# Patient Record
Sex: Female | Born: 2000
Health system: Southern US, Community
[De-identification: ages and names within clinical notes are randomized; demographics above are authoritative.]

## PROBLEM LIST (undated history)

## (undated) DIAGNOSIS — R51 Headache: Secondary | ICD-10-CM

## (undated) DIAGNOSIS — F419 Anxiety disorder, unspecified: Secondary | ICD-10-CM

## (undated) DIAGNOSIS — R519 Headache, unspecified: Secondary | ICD-10-CM

## (undated) DIAGNOSIS — T7840XA Allergy, unspecified, initial encounter: Secondary | ICD-10-CM

## (undated) DIAGNOSIS — J45909 Unspecified asthma, uncomplicated: Secondary | ICD-10-CM

## (undated) DIAGNOSIS — G43909 Migraine, unspecified, not intractable, without status migrainosus: Secondary | ICD-10-CM

## (undated) HISTORY — DX: Allergy, unspecified, initial encounter: T78.40XA

## (undated) HISTORY — PX: OTHER SURGICAL HISTORY: SHX169

## (undated) HISTORY — DX: Headache: R51

## (undated) HISTORY — DX: Anxiety disorder, unspecified: F41.9

## (undated) HISTORY — DX: Migraine, unspecified, not intractable, without status migrainosus: G43.909

## (undated) HISTORY — DX: Headache, unspecified: R51.9

---
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
## 2022-01-10 ENCOUNTER — Encounter: Payer: Self-pay | Admitting: Neurology

## 2022-01-11 ENCOUNTER — Other Ambulatory Visit (HOSPITAL_COMMUNITY): Payer: Self-pay

## 2022-01-11 ENCOUNTER — Encounter: Payer: Self-pay | Admitting: Neurology

## 2022-01-11 ENCOUNTER — Telehealth: Payer: Self-pay | Admitting: Neurology

## 2022-01-11 ENCOUNTER — Telehealth (INDEPENDENT_AMBULATORY_CARE_PROVIDER_SITE_OTHER): Payer: 59 | Admitting: Neurology

## 2022-01-11 DIAGNOSIS — J3089 Other allergic rhinitis: Secondary | ICD-10-CM

## 2022-01-11 DIAGNOSIS — G43109 Migraine with aura, not intractable, without status migrainosus: Secondary | ICD-10-CM | POA: Diagnosis not present

## 2022-01-11 DIAGNOSIS — J302 Other seasonal allergic rhinitis: Secondary | ICD-10-CM | POA: Diagnosis not present

## 2022-01-11 DIAGNOSIS — G43009 Migraine without aura, not intractable, without status migrainosus: Secondary | ICD-10-CM

## 2022-01-11 DIAGNOSIS — G44219 Episodic tension-type headache, not intractable: Secondary | ICD-10-CM | POA: Diagnosis not present

## 2022-01-11 DIAGNOSIS — T7840XA Allergy, unspecified, initial encounter: Secondary | ICD-10-CM

## 2022-01-11 MED ORDER — UBRELVY 100 MG PO TABS
100.0000 mg | ORAL_TABLET | ORAL | 11 refills | Status: DC | PRN
  Filled 2022-01-11 (×2): qty 16, 30d supply, fill #0

## 2022-01-11 MED ORDER — QULIPTA 60 MG PO TABS
1.0000 | ORAL_TABLET | Freq: Every day | ORAL | 11 refills | Status: DC
  Filled 2022-01-11: qty 30, 30d supply, fill #0
  Filled 2022-02-14: qty 30, 30d supply, fill #1
  Filled 2022-03-18: qty 30, 30d supply, fill #2
  Filled 2022-04-15: qty 30, 30d supply, fill #3
  Filled 2022-05-18: qty 30, 30d supply, fill #4
  Filled 2022-06-24: qty 30, 30d supply, fill #5
  Filled 2022-07-25: qty 30, 30d supply, fill #6
  Filled 2022-08-25: qty 30, 30d supply, fill #7
  Filled 2022-09-28: qty 30, 30d supply, fill #8
  Filled 2022-10-26: qty 30, 30d supply, fill #9
  Filled 2022-11-30: qty 30, 30d supply, fill #10

## 2022-01-11 NOTE — Telephone Encounter (Signed)
LVM asking pt to call back to schedule f/u

## 2022-01-11 NOTE — Telephone Encounter (Signed)
Can we prioritzie her PA for [place] she has run out and had to wait for appointment

## 2022-01-11 NOTE — Telephone Encounter (Signed)
Please schedule video f/u 10 months with dr Jaynee Eagles

## 2022-01-11 NOTE — Telephone Encounter (Signed)
Will look into Ubrelvy. Lenoria Chime was already approved through 12/2022.

## 2022-01-11 NOTE — Progress Notes (Signed)
[facility]    Provider:  Dr Jaynee Eagles Requesting Provider: Alba Cory, MD Primary Care Provider:  Alba Cory, MD  CC:  migraines  Virtual Visit via Video Note  I connected with Eileen Snyder on 01/11/22 at  8:30 AM EST by a video enabled telemedicine application and verified that I am speaking with the correct person using two identifiers.  Location: Patient: home Provider: office   I discussed the limitations of evaluation and management by telemedicine and the availability of in person appointments. The patient expressed understanding and agreed to proceed.   Follow Up Instructions:    I discussed the assessment and treatment plan with the patient. The patient was provided an opportunity to ask questions and all were answered. The patient agreed with the plan and demonstrated an understanding of the instructions.   The patient was advised to call back or seek an in-person evaluation if the symptoms worsen or if the condition fails to improve as anticipated.  I provided 20 minutes of non-face-to-face time during this encounter.   Melvenia Beam, MD  01/11/2022: She is doing well, she has 50% less headaches and migraines. When she does get them they are the same severity. She ran out of Ubrely. The ubrelvy would knock. Less than 4 migraine days a month and 4 headaches a month for a total of < 8 total headache days a month. No other focal neurologic deficits, associated symptoms, inciting events or modifiable factors. Patient complains of symptoms per HPI as well as the following symptoms: migraines . Pertinent negatives and positives per HPI. All others negative  Patient complains of symptoms per HPI as well as the following symptoms: dizzy wiith migraines . Pertinent negatives and positives per HPI. All others negative    HPI:  Eileen Snyder is a 21 y.o. adult here as requested by Alba Cory, MD for migraines.  She has a past medical history of  migraine without aura and without status migrainosus not intractable, migraine variant, episodic tension type headaches, temporomandibular joint dysfunction syndrome, family history of migraine, tremor of both hands, asthma.  I reviewed her chart in epic she last saw Dr. Gaynell Face in September 2022 for her migraine, prior to that she saw him in March of this year, her migraines have responded well to sumatriptan hand, she could not remember the last time she had a migraine either cut her mother, occasional tension type headaches that are treated with 2 Excedrin and respond well, they think that they had 1 visual aura without headache the past month, she is on Topamax and that caused the tremor also takes magnesium with helps in addition to the topiramate.  She is here alone. Red40 is a trigger, when she eats something that is red. They have been a lot better. She still has tension headaches a lot. Nosesprays and allergies make it worse and allergies are connected. Her migraines are all over her head, can start unilaterally then spread, it will start pulling and hurt everywhere, pulsating, poundng,throbbing, at the occipital areas sometimes. She does hapkaido. She gets exertional headaches with dizziness especially with hapkedo and head extension she almost passes out with neck pain. Migraines arepulsating/pounding/throbbing,rare nausea, light and sound sensitivity can be unilateral or occipital and spread around. What she calls "tension headaches" is left eye and front are of the face/facial pain.Gets allergies and this is what she thinks she may be getting. Sinus headaches, drainage and congestion all the time.  - may be tension type  headaches vs part of a migraine disorder or due to Allergies, sinus headaches, allergies: send to allergy referral. For the last several years she has 12 headaches days a month and unclear if migrainous, tension type, allergies, takes excedrin. She may get an accoassionally. White  line occ aura. Discussed risk of stroke. She takes magnesium which helps.   Reviewed notes, labs and imaging from outside physicians, which showed: see above  Review of Systems: Patient complains of symptoms per HPI as well as the following symptoms sinus headache. Pertinent negatives and positives per HPI. All others negative.   Social History   Socioeconomic History   Marital status: Single    Spouse name: Not on file   Number of children: Not on file   Years of education: Not on file   Highest education level: Not on file  Occupational History   Not on file  Tobacco Use   Smoking status: Never    Passive exposure: Never   Smokeless tobacco: Never  Vaping Use   Vaping Use: Never used  Substance and Sexual Activity   Alcohol use: No   Drug use: Never   Sexual activity: Not Currently  Other Topics Concern   Not on file  Social History Narrative   Eileen Snyder is a 11th grade student.   She attends [employer].   She lives with her parents and has an 44 yo sister.   She enjoys video games, [place], and her Ipad.   Social Determinants of Health   Financial Resource Strain: Not on file  Food Insecurity: Not on file  Transportation Needs: Not on file  Physical Activity: Not on file  Stress: Not on file  Social Connections: Not on file  Intimate Partner Violence: Not on file    Family History  Problem Relation Age of Onset   Migraines Mother     Past Medical History:  Diagnosis Date   Asthma    Headache     Patient Active Problem List   Diagnosis Date Noted   Seasonal and perennial allergic rhinitis 02/19/2021   Food intolerance 02/19/2021   Migraine with aura and without status migrainosus, not intractable 02/08/2021   Migraine variant 05/13/2019   Tremor of both hands 02/11/2019   Temporomandibular joint dysfunction syndrome 02/22/2016   Episodic tension type headache 02/22/2016   Migraine without aura and without status migrainosus, not intractable  02/22/2016   Family history of migraine 02/22/2016    Past Surgical History:  Procedure Laterality Date   Birthmark Removal     HIP ARTHROSCOPY Right 09/09/2021   LAPAROSCOPIC APPENDECTOMY N/A 06/06/2021   Procedure: APPENDECTOMY LAPAROSCOPIC;  Surgeon: Ralene Ok, MD;  Location: [facility];  Service: General;  Laterality: N/A;    Current Outpatient Medications  Medication Sig Dispense Refill   albuterol (PROVENTIL HFA;VENTOLIN HFA) 108 (90 Base) MCG/ACT inhaler Inhale 2 puffs into the lungs every 6 (six) hours as needed for wheezing or shortness of breath. 18 g 11   Atogepant (QULIPTA) 60 MG TABS Take 1 tablet by mouth daily. 30 tablet 11   atomoxetine (STRATTERA) 40 MG capsule Take 1 capsule (40 mg total) by mouth in the morning. 30 capsule 0   atomoxetine (STRATTERA) 60 MG capsule Take 1 capsule (60 mg total) by mouth in the morning. 30 capsule 2   azelastine (ASTELIN) 0.1 % nasal spray Use 2 sprays per nostril 1-2 times daily as needed. 30 mL 1   meloxicam (MOBIC) 15 MG tablet Take 1 tablet (  15 mg total) by mouth in the morning. Begin on POD #5 once indomethacin is complete. Take with food. 30 tablet 0   montelukast (SINGULAIR) 10 MG tablet Take 1 tablet (10 mg total) by mouth at bedtime. 90 tablet 3   ondansetron (ZOFRAN-ODT) 4 MG disintegrating tablet Take 1 tablet (4 mg total) by mouth every 8 (eight) hours as needed for nausea or vomiting for up to 7 day 20 tablet 0   sertraline (ZOLOFT) 50 MG tablet Take 1/2 tablet by mouth daily for 7 days, then take 1 tablet daily 30 tablet 2   traMADol (ULTRAM) 50 MG tablet Take 1 tablet (50 mg total) by mouth every 6 (six) hours as needed. 20 tablet 0   Ubrogepant (UBRELVY) 100 MG TABS Take 1 tablet by mouth every 2 (two) hours as needed. Maximum '200mg'$  a day. 16 tablet 11   No current facility-administered medications for this visit.    Allergies as of 01/11/2022 - Review Complete 12/16/2021  Allergen Reaction Noted   Amoxicillin Hives  and Rash 10/26/2014    Vitals: LMP 11/25/2021 (Approximate)  Last Weight:  Wt Readings from Last 1 Encounters:  12/16/21 135 lb (61.2 kg) (66 %, Z= 0.40)*   * Growth percentiles are based on CDC (Girls, 2-20 Years) data.   Last Height:   Ht Readings from Last 1 Encounters:  12/16/21 '5\' 6"'$  (1.676 m) (75 %, Z= 0.68)*   * Growth percentiles are based on CDC (Girls, 2-20 Years) data.    Physical exam: Exam: Gen: NAD, conversant      CV:  Denies palpitations or chest pain or SOB. VS: Breathing at a normal rate.  Not febrile. Eyes: Conjunctivae clear without exudates or hemorrhage  Neuro: Detailed Neurologic Exam  Speech:    Speech is normal; fluent and spontaneous with normal comprehension.  Cognition:    The patient is oriented to person, place, and time;     recent and remote memory intact;     language fluent;     normal attention, concentration,     fund of knowledge Cranial Nerves:    The pupils are equal, round, and reactive to light. Visual fields are full to finger confrontation. Extraocular movements are intact.  The face is symmetric with normal sensation. The palate elevates in the midline. Hearing intact. Voice is normal. Shoulder shrug is normal. The tongue has normal motion without fasciculations.   Coordination:    Normal finger to nose  Gait:    Normal native gait  Motor Observation:   no involuntary movements noted. Tone:    Appears normal  Posture:    Posture is normal. normal erect    Strength:    Strength is anti-gravity and symmetric in the upper and lower limbs.      Sensation: intact to LT         Assessment/Plan:  Patient with migraines doing great on [place]  She is doing well, she has 50% less headaches and migraines. When she does get them they are the same severity. She ran out of Ubrely. The ubrelvy would knock. Less than 4 migraine days a month and 4 headaches a month for a total of < 8 total headache days a  month.  - Neck Pain: Discussed Occipital nerve - stretches online, posture, heating, avoiding dead flexion all day. [email or web address] is an option or PT, better   - Gets allergies and this is why she thinks she may be getting headaches, Sinus headaches, drainage and  congestion all the time. Referral to allergist.  - may also have tension type headaches vs part of a migraine disorder or due to Allergies, sinus headaches, allergies: send to allergy referral, went to allergy helped a lot  - discussed increased risk of stroke in patients with aura.  - Acutely: Roselyn Meier  - Prevention: continue qulipta  - reading material about migraine and migraine management  - we have a lof of new meds on the market discussed: (Ajovy,emgality,ubrelvy,qulipta,nurtec etc - CGRP class that you can read up on)  - got an eye exam  No orders of the defined types were placed in this encounter.  Meds ordered this encounter  Medications   Atogepant (QULIPTA) 60 MG TABS    Sig: Take 1 tablet by mouth daily.    Dispense:  30 tablet    Refill:  11   Ubrogepant (UBRELVY) 100 MG TABS    Sig: Take 1 tablet by mouth every 2 (two) hours as needed. Maximum '200mg'$  a day.    Dispense:  16 tablet    Refill:  11    Cc: Alba Cory, MD,  Alba Cory, MD  Sarina Ill, MD  [facility] [address] [place], [place] [address]  Phone [phone] Fax [phone]

## 2022-01-11 NOTE — Telephone Encounter (Addendum)
Completed Roselyn Meier renewal PA. Key: [id number]. Approved by [employer] immediately.  Approved today The request has been approved. The authorization is effective from 01/11/2022 to 01/11/2023, as long as the member is enrolled in their current health plan. The request was approved with a quantity restriction. This has been approved for a max daily dosage of 0.53. A written notification letter will follow with additional details.

## 2022-01-13 ENCOUNTER — Other Ambulatory Visit (HOSPITAL_COMMUNITY): Payer: Self-pay

## 2022-01-13 ENCOUNTER — Other Ambulatory Visit: Payer: Self-pay

## 2022-01-13 MED ORDER — ATOMOXETINE HCL 40 MG PO CAPS
40.0000 mg | ORAL_CAPSULE | Freq: Every evening | ORAL | 0 refills | Status: DC
  Filled 2022-01-13 (×2): qty 30, 30d supply, fill #0

## 2022-02-04 DIAGNOSIS — F411 Generalized anxiety disorder: Secondary | ICD-10-CM | POA: Diagnosis not present

## 2022-02-04 DIAGNOSIS — F909 Attention-deficit hyperactivity disorder, unspecified type: Secondary | ICD-10-CM | POA: Diagnosis not present

## 2022-02-14 ENCOUNTER — Other Ambulatory Visit (HOSPITAL_COMMUNITY): Payer: Self-pay

## 2022-03-09 DIAGNOSIS — F909 Attention-deficit hyperactivity disorder, unspecified type: Secondary | ICD-10-CM | POA: Diagnosis not present

## 2022-03-09 DIAGNOSIS — F411 Generalized anxiety disorder: Secondary | ICD-10-CM | POA: Diagnosis not present

## 2022-03-18 ENCOUNTER — Other Ambulatory Visit (HOSPITAL_COMMUNITY): Payer: Self-pay

## 2022-03-25 DIAGNOSIS — M25851 Other specified joint disorders, right hip: Secondary | ICD-10-CM | POA: Diagnosis not present

## 2022-04-12 ENCOUNTER — Other Ambulatory Visit (HOSPITAL_COMMUNITY): Payer: Self-pay

## 2022-04-15 ENCOUNTER — Other Ambulatory Visit (HOSPITAL_COMMUNITY): Payer: Self-pay

## 2022-04-22 DIAGNOSIS — F909 Attention-deficit hyperactivity disorder, unspecified type: Secondary | ICD-10-CM | POA: Diagnosis not present

## 2022-04-22 DIAGNOSIS — F411 Generalized anxiety disorder: Secondary | ICD-10-CM | POA: Diagnosis not present

## 2022-05-02 ENCOUNTER — Encounter: Payer: Self-pay | Admitting: Family

## 2022-05-02 ENCOUNTER — Other Ambulatory Visit (HOSPITAL_COMMUNITY): Payer: Self-pay

## 2022-05-02 ENCOUNTER — Ambulatory Visit: Payer: Commercial Managed Care - PPO | Admitting: Family

## 2022-05-02 VITALS — BP 130/80 | HR 109 | Temp 99.3°F | Resp 16 | Ht 65.5 in | Wt 159.6 lb

## 2022-05-02 DIAGNOSIS — J3089 Other allergic rhinitis: Secondary | ICD-10-CM

## 2022-05-02 DIAGNOSIS — J4531 Mild persistent asthma with (acute) exacerbation: Secondary | ICD-10-CM | POA: Diagnosis not present

## 2022-05-02 DIAGNOSIS — K9049 Malabsorption due to intolerance, not elsewhere classified: Secondary | ICD-10-CM

## 2022-05-02 DIAGNOSIS — J302 Other seasonal allergic rhinitis: Secondary | ICD-10-CM

## 2022-05-02 MED ORDER — ALBUTEROL SULFATE HFA 108 (90 BASE) MCG/ACT IN AERS
2.0000 | INHALATION_SPRAY | RESPIRATORY_TRACT | 1 refills | Status: DC
  Filled 2022-05-02: qty 6.7, 17d supply, fill #0
  Filled 2022-10-19: qty 6.7, 17d supply, fill #1

## 2022-05-02 MED ORDER — AZELASTINE HCL 0.1 % NA SOLN
2.0000 | Freq: Two times a day (BID) | NASAL | 1 refills | Status: DC
  Filled 2022-05-02: qty 30, 25d supply, fill #0

## 2022-05-02 MED ORDER — PREDNISONE 10 MG PO TABS
ORAL_TABLET | ORAL | 0 refills | Status: AC
  Filled 2022-05-02: qty 15, 5d supply, fill #0

## 2022-05-02 MED ORDER — MONTELUKAST SODIUM 10 MG PO TABS
10.0000 mg | ORAL_TABLET | Freq: Every day | ORAL | 1 refills | Status: DC
  Filled 2022-05-02 – 2022-07-12 (×2): qty 90, 90d supply, fill #0
  Filled 2022-10-19: qty 90, 90d supply, fill #1

## 2022-05-02 NOTE — Progress Notes (Signed)
[name] [id number] Dept: [phone]  FOLLOW UP NOTE  Patient ID: Eileen Snyder, adult    DOB: 07-Sep-2000  Age: 22 y.o. MRN: [id number] Date of Office Visit: 05/02/2022  Assessment  Chief Complaint: No chief complaint on file.  HPI Eileen Snyder is 22 years old and presents today for an acute visit of chest tightness.  She was last seen on May 25, 2021 by Dr. Ernst Bowler for seasonal and perennial allergic rhinitis, food intolerance (red dye), migraines, ADHD, and mild persistent asthma. She reports that since her last office visit she had an appendectomy on June 06, 2021 and right hip surgery on July 2023.  Her mom is here with her today and helps provide history.  Mild persistent asthma: She continues to take montelukast 10 mg once a day and has albuterol to use as needed.  She reports Friday night she had a lot of tightness in her chest and a lot of mucus in her chest.  She has been using her albuterol every 4 hours and this does help.  She last used her albuterol around 8:00 this morning.  She also complains of not being able to get a full breath.  She reports a productive cough with very white sputum or greenish sputum.  When the mucus does come up and hit the back of her throat it does burn and itch.  She also has tightness in her chest, wheezing, shortness of breath, and nocturnal awakenings due to breathing problems.  Since her last office visit she has not required any systemic steroids or made any trips to the emergency room or urgent care due to breathing problems.  She does mention that this is the first time since being on Singulair for approximately a year and a half that she has had issues with her asthma.  She denies fever or chills, but reports a little bit of body aches and headaches.  She mentions that her roommate visited a partner and they had [place].  She did take a COVID-19 test yesterday and reports it was negative.  She has not tested for  influenza.  Seasonal and perennial allergic rhinitis.  She reports a little bit of postnasal drip and on Saturday she noticed she was getting a raspy voice.  She had a little bit of nasal congestion this morning only.  She denies rhinorrhea.  She has not had any sinus infections since we last saw her.  She does mention that her throat is a little sore when she swallows, but is mainly hurting when she coughs.  She continues to take montelukast 10 mg once a day and has not used Astelin nasal spray.   Drug Allergies:  Allergies  Allergen Reactions   Amoxicillin Hives and Rash    Review of Systems: Review of Systems  Constitutional:  Negative for chills and fever.       Does report body aches  HENT:         Reports some post nasal drip. Also reports nasal congestion this morning. Voice has also been raspy. Denies rhinorrhea  Eyes:        Denies itchy watery eyes  Respiratory:  Positive for cough, shortness of breath and wheezing.        Reports productive cough that can be white or green, wheezing, tightness in chest, shortness of breath and nocturnal awakenings due to breathing problems  Cardiovascular:  Negative for palpitations.  Gastrointestinal:  Denies heartburn or reflux  Genitourinary:  Negative for frequency.  Skin:  Negative for itching and rash.  Neurological:  Positive for headaches.       Reports headaches since Saturday  Endo/Heme/Allergies:  Positive for environmental allergies.     Physical Exam: BP 130/80   Pulse (!) 109   Temp 99.3 F (37.4 C) (Oral)   Resp 16   Ht 5' 5.5" (1.664 m)   Wt 159 lb 9.6 oz (72.4 kg)   SpO2 99%   BMI 26.15 kg/m    Physical Exam Exam conducted with a chaperone present.  Constitutional:      Appearance: Normal appearance.  HENT:     Head: Normocephalic and atraumatic.     Comments: Pharynx normal, eyes normal, ears normal, nose: Bilateral lower turbinates mildly edematous and slightly erythematous with green drainage  noted    Right Ear: Tympanic membrane, ear canal and external ear normal.     Left Ear: Tympanic membrane, ear canal and external ear normal.     Mouth/Throat:     Mouth: Mucous membranes are moist.     Pharynx: Oropharynx is clear.  Eyes:     Conjunctiva/sclera: Conjunctivae normal.  Cardiovascular:     Rate and Rhythm: Regular rhythm.     Heart sounds: Normal heart sounds.  Pulmonary:     Effort: Pulmonary effort is normal.     Breath sounds: Normal breath sounds.     Comments: Lungs clear to auscultation Musculoskeletal:     Cervical back: Neck supple.  Skin:    General: Skin is warm.  Neurological:     Mental Status: Eileen Snyder is alert and oriented to person, place, and time.  Psychiatric:        Mood and Affect: Mood normal.        Behavior: Behavior normal.        Thought Content: Thought content normal.        Judgment: Judgment normal.     Diagnostics: FVC 3.62 L (90%), FEV1 2.94 L (84%).  Spirometry indicates normal spirometry.  Assessment and Plan: 1. Mild persistent asthma with (acute) exacerbation   2. Seasonal and perennial allergic rhinitis   3. Food intolerance     Meds ordered this encounter  Medications   predniSONE (DELTASONE) 10 MG tablet    Sig: Take 2 tablets twice a day for 3 days, then on the 4th day take two tablets in the morning, and on the 5th day take 1 tablet and stop    Dispense:  15 tablet    Refill:  0   montelukast (SINGULAIR) 10 MG tablet    Sig: Take 1 tablet (10 mg total) by mouth at bedtime.    Dispense:  90 tablet    Refill:  1   albuterol (VENTOLIN HFA) 108 (90 Base) MCG/ACT inhaler    Sig: Inhale 2 puffs every 4-6 hours as needed for cough, wheeze, tightness in chest, or shortness of breath    Dispense:  18 g    Refill:  1   azelastine (ASTELIN) 0.1 % nasal spray    Sig: Use 2 sprays per nostril 1-2 times daily as needed.    Dispense:  30 mL    Refill:  1    Patient Instructions  1. Seasonal and perennial allergic  rhinitis (grasses, trees, dust mites, cat, dog, and horse) - Continue taking: Singulair (montelukast) '10mg'$  daily and the Astelin (azelastine) 2 sprays per nostril 1-2 times daily as needed -  You can use an extra dose of the antihistamine, if needed, for breakthrough symptoms.  - Consider nasal saline rinses 1-2 times daily to remove allergens from the nasal cavities as well as help with mucous clearance (this is especially helpful to do before the nasal sprays are given)  2. Mild persistent asthma with acute exacerbation - Start prednisone 10 mg taking 2 tablets twice a day for 3 days, then on the 4th day take 2 tablets in the morning and on the 5th day take one tablet and stop. Reviewed side effects of prednisone - Continue with montelukast '10mg'$  daily. - Continue with albuterol 2 puffs every 4-6 hours as needed for cough,wheeze, tightness in chest, or shortness of breath.  - Consider adding on a daily inhaled corticosteroid inhaler if symptoms persist  Asthma control goals:  Full participation in all desired activities (may need albuterol before activity) Albuterol use two time or less a week on average (not counting use with activity) Cough interfering with sleep two time or less a month Oral steroids no more than once a year No hospitalizations   Consider getting tested for influenza due to body aches,low grade temperature, and headache  3. Schedule a follow up appointment in 6-8 weeks or sooner if needed         Return in about 6 weeks (around 06/13/2022), or if symptoms worsen or fail to improve.    Thank you for the opportunity to care for this patient.  Please do not hesitate to contact me with questions.  Althea Charon, FNP [facility] [employer] [facility] [place]

## 2022-05-02 NOTE — Patient Instructions (Addendum)
1. Seasonal and perennial allergic rhinitis (grasses, trees, dust mites, cat, dog, and horse) - Continue taking: Singulair (montelukast) '10mg'$  daily and the Astelin (azelastine) 2 sprays per nostril 1-2 times daily as needed - You can use an extra dose of the antihistamine, if needed, for breakthrough symptoms.  - Consider nasal saline rinses 1-2 times daily to remove allergens from the nasal cavities as well as help with mucous clearance (this is especially helpful to do before the nasal sprays are given)  2. Mild persistent asthma with acute exacerbation - Start prednisone 10 mg taking 2 tablets twice a day for 3 days, then on the 4th day take 2 tablets in the morning and on the 5th day take one tablet and stop. Reviewed side effects of prednisone - Continue with montelukast '10mg'$  daily. - Continue with albuterol 2 puffs every 4-6 hours as needed for cough,wheeze, tightness in chest, or shortness of breath.  - Consider adding on a daily inhaled corticosteroid inhaler if symptoms persist  Asthma control goals:  Full participation in all desired activities (may need albuterol before activity) Albuterol use two time or less a week on average (not counting use with activity) Cough interfering with sleep two time or less a month Oral steroids no more than once a year No hospitalizations   Consider getting tested for influenza due to body aches,low grade temperature, and headache  3. Schedule a follow up appointment in 6-8 weeks or sooner if needed

## 2022-05-08 ENCOUNTER — Encounter (HOSPITAL_COMMUNITY): Payer: Self-pay

## 2022-05-08 ENCOUNTER — Ambulatory Visit (HOSPITAL_COMMUNITY)
Admission: EM | Admit: 2022-05-08 | Discharge: 2022-05-08 | Disposition: A | Discharge status: Home / Self Care | Payer: Commercial Managed Care - PPO | Attending: Emergency Medicine | Admitting: Emergency Medicine

## 2022-05-08 DIAGNOSIS — J4521 Mild intermittent asthma with (acute) exacerbation: Secondary | ICD-10-CM | POA: Diagnosis not present

## 2022-05-08 DIAGNOSIS — B349 Viral infection, unspecified: Secondary | ICD-10-CM

## 2022-05-08 MED ORDER — AZITHROMYCIN 250 MG PO TABS: 250.0000 mg | ORAL_TABLET | Freq: Every day | ORAL | 0 refills | Status: DC

## 2022-05-08 MED ORDER — PREDNISONE 20 MG PO TABS: 40.0000 mg | ORAL_TABLET | Freq: Every day | ORAL | 0 refills | Status: DC

## 2022-05-08 NOTE — Discharge Instructions (Signed)
Uses azithromycin taking as directed to provide coverage for bacteria which may be aiding t to your symptoms  Begin use of prednisone taking every morning for 5 days to help reduce inflammation and relax the airway    Continue use of rescue inhaler as directed  You can take Tylenol and/or Ibuprofen as needed for fever reduction and pain relief.   For cough: honey 1/2 to 1 teaspoon (you can dilute the honey in water or another fluid).  You can also use guaifenesin and dextromethorphan for cough. You can use a humidifier for chest congestion and cough.  If you don't have a humidifier, you can sit in the bathroom with the hot shower running.      For sore throat: try warm salt water gargles, cepacol lozenges, throat spray, warm tea or water with lemon/honey, popsicles or ice, or OTC cold relief medicine for throat discomfort.   For congestion: take a daily anti-histamine like Zyrtec, Claritin, and a oral decongestant, such as pseudoephedrine.  You can also use Flonase 1-2 sprays in each nostril daily.   It is important to stay hydrated: drink plenty of fluids (water, gatorade/powerade/pedialyte, juices, or teas) to keep your throat moisturized and help further relieve irritation/discomfort.   No improvement seen with your symptoms after completion of medication please follow-up for reevaluation

## 2022-05-08 NOTE — ED Provider Notes (Signed)
[facility]    CSN: [id number] Arrival date & time: 05/08/22  1015      History   Chief Complaint Chief Complaint  Patient presents with   Cough    HPI Eileen Snyder is a 22 y.o. adult.   Patient presents for evaluation of nasal congestion, rhinorrhea, productive cough with green sputum and shortness of breath with exertion present for 7 days.  Wheezing experienced only in the mornings, improves throughout the day.  Endorses right-sided ear popping and fullness only when blowing nose.  Has been evaluated by her allergist who prescribed prednisone, no improvement seen.  History of asthma, has been using albuterol inhaler with minimal relief.  No known sick contacts [place]  Additionally has attempted use of Mucinex.   Past Medical History:  Diagnosis Date   Asthma    Headache     Patient Active Problem List   Diagnosis Date Noted   Seasonal and perennial allergic rhinitis 02/19/2021   Food intolerance 02/19/2021   Migraine with aura and without status migrainosus, not intractable 02/08/2021   Migraine variant 05/13/2019   Tremor of both hands 02/11/2019   Temporomandibular joint dysfunction syndrome 02/22/2016   Episodic tension type headache 02/22/2016   Migraine without aura and without status migrainosus, not intractable 02/22/2016   Family history of migraine 02/22/2016    Past Surgical History:  Procedure Laterality Date   Birthmark Removal     HIP ARTHROSCOPY Right 09/09/2021   LAPAROSCOPIC APPENDECTOMY N/A 06/06/2021   Procedure: APPENDECTOMY LAPAROSCOPIC;  Surgeon: Ralene Ok, MD;  Location: [facility];  Service: General;  Laterality: N/A;    OB History   No obstetric history on file.      Home Medications    Prior to Admission medications   Medication Sig Start Date End Date Taking? Authorizing Provider  albuterol (VENTOLIN HFA) 108 (90 Base) MCG/ACT inhaler Inhale 2 puffs every 4-6 hours as needed for cough,  wheezing, tightness in chest, or shortness of breath 05/02/22  Yes Althea Charon, FNP  Atogepant (QULIPTA) 60 MG TABS Take 1 tablet by mouth daily. 01/11/22  Yes Melvenia Beam, MD  azelastine (ASTELIN) 0.1 % nasal spray Place 2 sprays into both nostrils 1-2 times daily. 05/02/22  Yes Althea Charon, FNP  montelukast (SINGULAIR) 10 MG tablet Take 1 tablet (10 mg total) by mouth at bedtime. 05/02/22  Yes Althea Charon, FNP  atomoxetine (STRATTERA) 40 MG capsule Take 1 capsule (40 mg total) by mouth in the morning. 06/16/21     atomoxetine (STRATTERA) 40 MG capsule Take 1 capsule (40 mg total) by mouth every evening. 01/13/22     atomoxetine (STRATTERA) 60 MG capsule Take 1 capsule (60 mg total) by mouth in the morning. 07/05/21     meloxicam (MOBIC) 15 MG tablet Take 1 tablet (15 mg total) by mouth in the morning. Begin on POD #5 once indomethacin is complete. Take with food. 09/08/21     ondansetron (ZOFRAN-ODT) 4 MG disintegrating tablet Take 1 tablet (4 mg total) by mouth every 8 (eight) hours as needed for nausea or vomiting for up to 7 day 09/08/21     traMADol (ULTRAM) 50 MG tablet Take 1 tablet (50 mg total) by mouth every 6 (six) hours as needed. 06/06/21 06/06/22  Ralene Ok, MD  Ubrogepant (UBRELVY) 100 MG TABS Take 1 tablet by mouth every 2 (two) hours as needed. Maximum of 2 tablets (200 mg total) a day. 01/11/22  Melvenia Beam, MD  sertraline (ZOLOFT) 50 MG tablet Take 1/2 tablet by mouth daily for 7 days, then take 1 tablet daily 11/04/21 01/13/22      Family History Family History  Problem Relation Age of Onset   Migraines Mother     Social History Social History   Tobacco Use   Smoking status: Never    Passive exposure: Never   Smokeless tobacco: Never  Vaping Use   Vaping Use: Never used  Substance Use Topics   Alcohol use: No   Drug use: Never     Allergies   Amoxicillin   Review of Systems Review of Systems  Constitutional: Negative.   HENT:  Positive for  congestion and rhinorrhea. Negative for dental problem, drooling, ear discharge, ear pain, facial swelling, hearing loss, mouth sores, nosebleeds, postnasal drip, sinus pressure, sinus pain, sneezing, sore throat, tinnitus, trouble swallowing and voice change.   Respiratory:  Positive for cough and shortness of breath. Negative for apnea, choking, chest tightness, wheezing and stridor.   Cardiovascular: Negative.   Gastrointestinal: Negative.   Skin: Negative.      Physical Exam Triage Vital Signs ED Triage Vitals  Enc Vitals Group     BP 05/08/22 1048 124/74     Pulse Rate 05/08/22 1048 94     Resp 05/08/22 1048 16     Temp 05/08/22 1048 98.1 F (36.7 C)     Temp Source 05/08/22 1048 Oral     SpO2 05/08/22 1048 97 %     Weight 05/08/22 1047 145 lb (65.8 kg)     Height 05/08/22 1047 5' 6.5" (1.689 m)     Head Circumference --      Peak Flow --      Pain Score 05/08/22 1047 0     Pain Loc --      Pain Edu? --      Excl. in [place]? --    No data found.  Updated Vital Signs BP 124/74 (BP Location: Left Arm)   Pulse 94   Temp 98.1 F (36.7 C) (Oral)   Resp 16   Ht 5' 6.5" (1.689 m)   Wt 145 lb (65.8 kg)   LMP 05/02/2022 (Approximate)   SpO2 97%   BMI 23.05 kg/m   Visual Acuity Right Eye Distance:   Left Eye Distance:   Bilateral Distance:    Right Eye Near:   Left Eye Near:    Bilateral Near:     Physical Exam Constitutional:      Appearance: Normal appearance.  HENT:     Right Ear: Tympanic membrane, ear canal and external ear normal.     Left Ear: Tympanic membrane, ear canal and external ear normal.     Nose: Congestion and rhinorrhea present.     Mouth/Throat:     Mouth: Mucous membranes are moist.     Pharynx: No posterior oropharyngeal erythema.  Eyes:     Extraocular Movements: Extraocular movements intact.  Cardiovascular:     Rate and Rhythm: Normal rate and regular rhythm.     Pulses: Normal pulses.     Heart sounds: Normal heart sounds.   Pulmonary:     Effort: Pulmonary effort is normal.     Breath sounds: Normal breath sounds.  Skin:    General: Skin is warm and dry.  Neurological:     Mental Status: Eileen Snyder is alert and oriented to person, place, and time. Mental status is at baseline.  Psychiatric:  Mood and Affect: Mood normal.        Behavior: Behavior normal.      UC Treatments / Results  Labs (all labs ordered are listed, but only abnormal results are displayed) Labs Reviewed - No data to display  EKG   Radiology No results found.  Procedures Procedures (including critical care time)  Medications Ordered in UC Medications - No data to display  Initial Impression / Assessment and Plan / UC Course  I have reviewed the triage vital signs and the nursing notes.  Pertinent labs & imaging results that were available during my care of the patient were reviewed by me and considered in my medical decision making (see chart for details).  Mild intermittent asthma with acute exacerbation, viral illness  Etiology of symptoms are most likely viral exacerbating asthma, discussed this with patient, vital signs are stable and patient is in no signs of distress, wheezing is heard throughout the upper and lower lobes, O2 saturation 97% on room air, will defer imaging at this time, prescribed azithromycin and additional prednisone burst for 5 days, patient endorses having enough medication on inhaler at home, given strict precautions that if symptoms persist or worsen past use that she will need to follow-up for reevaluation as a chest x-ray is warranted at that time Final Clinical Impressions(s) / UC Diagnoses   Final diagnoses:  None   Discharge Instructions   None    ED Prescriptions   None    PDMP not reviewed this encounter.   Hans Eden, NP 05/08/22 [phone]

## 2022-05-08 NOTE — ED Triage Notes (Signed)
Patient here for cough, wheeze, and SOB for over a week now. Patient has h/o asthma and went to allergist and was prescribed Prednisone but not helping. She has also tried taking Mucinex with no relief. No sick contacts or recent travel.

## 2022-05-18 ENCOUNTER — Other Ambulatory Visit (HOSPITAL_COMMUNITY): Payer: Self-pay

## 2022-06-10 ENCOUNTER — Other Ambulatory Visit (HOSPITAL_COMMUNITY): Payer: Self-pay

## 2022-06-10 ENCOUNTER — Encounter: Payer: Self-pay | Admitting: Family Medicine

## 2022-06-10 ENCOUNTER — Ambulatory Visit: Payer: Commercial Managed Care - PPO | Admitting: Family Medicine

## 2022-06-10 VITALS — BP 118/78 | HR 78 | Temp 98.6°F | Ht 65.0 in | Wt 162.0 lb

## 2022-06-10 DIAGNOSIS — F419 Anxiety disorder, unspecified: Secondary | ICD-10-CM | POA: Diagnosis not present

## 2022-06-10 DIAGNOSIS — Z0000 Encounter for general adult medical examination without abnormal findings: Secondary | ICD-10-CM

## 2022-06-10 DIAGNOSIS — F909 Attention-deficit hyperactivity disorder, unspecified type: Secondary | ICD-10-CM | POA: Diagnosis not present

## 2022-06-10 DIAGNOSIS — F411 Generalized anxiety disorder: Secondary | ICD-10-CM | POA: Diagnosis not present

## 2022-06-10 MED ORDER — PROPRANOLOL HCL 10 MG PO TABS
10.0000 mg | ORAL_TABLET | Freq: Three times a day (TID) | ORAL | 1 refills | Status: AC | PRN
  Filled 2022-06-10: qty 30, 10d supply, fill #0

## 2022-06-10 MED ORDER — ESCITALOPRAM OXALATE 10 MG PO TABS
10.0000 mg | ORAL_TABLET | Freq: Every day | ORAL | 1 refills | Status: DC
  Filled 2022-06-10: qty 30, 30d supply, fill #0

## 2022-06-10 NOTE — Patient Instructions (Addendum)
Welcome to [facility]! It was a pleasure meeting you today.  As discussed, Please schedule a 1 month follow up visit today. Lexapro 1/2 tab per day for 1 week(s) then whole tab Propranolol for events.  PLEASE NOTE:  If you had any LAB tests please let us know if you have not heard back within a few days. You may see your results on MyChart before we have a chance to review them but we will give you a call once they are reviewed by [place]. If we ordered any REFERRALS today, please let us know if you have not heard from their office within the next week.  Let us know through MyChart if you are needing REFILLS, or have your pharmacy send [place] the request. You can also use MyChart to communicate with me or any office staff.  Please try these tips to maintain a healthy lifestyle:  Eat most of your calories during the day when you are active. Eliminate processed foods including packaged sweets (pies, cakes, cookies), reduce intake of potatoes, white bread, white pasta, and white rice. Look for whole grain options, oat flour or almond flour.  Each meal should contain half fruits/vegetables, one quarter protein, and one quarter carbs (no bigger than a computer mouse).  Cut down on sweet beverages. This includes juice, soda, and sweet tea. Also watch fruit intake, though this is a healthier sweet option, it still contains natural sugar! Limit to 3 servings daily.  Drink at least 1 glass of water with each meal and aim for at least 8 glasses per day  Exercise at least 150 minutes every week.

## 2022-06-10 NOTE — Progress Notes (Unsigned)
Phone [phone]   Subjective:   Patient is a 22 y.o. adult presenting for annual physical.    Chief Complaint  Patient presents with   Establish Care    Initial visit to establish care with new pcp Would like a CPE    New patient annual-  See problem oriented charting- ROS- ROS: Gen: no fever, chills  Skin: no rash, itching ENT: no ear pain, ear drainage, nasal congestion, rhinorrhea, sinus pressure, sore throat Eyes: no blurry vision, double vision Resp: no cough, wheeze,SHORTNESS OF BREATH.  Asthma-rescue 1-2x/month(s).  Does well on singulair unless sick. CV: no CP, palpitations, LE edema,  GI: no heartburn, n/v/d/c, abd pain GU: no dysuria, urgency, frequency, hematuria.  Never SA.  Menses regular. MSK: no joint pain, myalgias, back pain.  Had surgery right hip.  Neuro: no dizziness, weakness, vertigo.   Migraine 3/week(s) or none.  Qulipta helps-excedrine helps for rescue.  Psych: no depression, insomnia, SI   some anxiety-daily, socially-hard to go to gatherings, worries a lot.  Has done therapy-has taken strattera, Zoloft. Strattera helped but gastroenterology pain.  Zoloft not work at all-tried for 1 [employer] as well.    The following were reviewed and entered/updated in epic: Past Medical History:  Diagnosis Date   Allergy    Anxiety    Asthma    Headache    Migraine    Patient Active Problem List   Diagnosis Date Noted   Seasonal and perennial allergic rhinitis 02/19/2021   Food intolerance 02/19/2021   Migraine with aura and without status migrainosus, not intractable 02/08/2021   Migraine variant 05/13/2019   Tremor of both hands 02/11/2019   Temporomandibular joint dysfunction syndrome 02/22/2016   Episodic tension type headache 02/22/2016   Migraine without aura and without status migrainosus, not intractable 02/22/2016   Family history of migraine 02/22/2016   Past Surgical History:  Procedure Laterality Date   Birthmark Removal      HIP ARTHROSCOPY Right 09/09/2021   LAPAROSCOPIC APPENDECTOMY N/A 06/06/2021   Procedure: APPENDECTOMY LAPAROSCOPIC;  Surgeon: Axel Filler, MD;  Location: [facility] OR;  Service: General;  Laterality: N/A;    Family History  Problem Relation Age of Onset   Depression Mother    Asthma Mother    Migraines Mother    Hypertension Father    Asthma Sister    Miscarriages / Stillbirths Maternal Grandmother    Heart disease Maternal Grandmother    COPD Maternal Grandmother    Asthma Maternal Grandmother    Cancer Maternal Grandfather        throat, colon   Alcohol abuse Maternal Grandfather    Hypertension Paternal Grandmother    Asthma Paternal Grandmother    Cancer Paternal Grandfather     Medications- reviewed and updated Current Outpatient Medications  Medication Sig Dispense Refill   albuterol (VENTOLIN HFA) 108 (90 Base) MCG/ACT inhaler Inhale 2 puffs every 4-6 hours as needed for cough, wheezing, tightness in chest, or shortness of breath 6.7 g 1   Atogepant (QULIPTA) 60 MG TABS Take 1 tablet by mouth daily. 30 tablet 11   montelukast (SINGULAIR) 10 MG tablet Take 1 tablet (10 mg total) by mouth at bedtime. 90 tablet 1   No current facility-administered medications for this visit.    Allergies-reviewed and updated Allergies  Allergen Reactions   Amoxicillin Hives, Rash and Anaphylaxis    Social History   Social History Narrative   Materials engineer sciences   Objective  Objective:  BP 118/78  Pulse 78   Temp 98.6 F (37 C) (Temporal)   Ht 5\' 5"  (1.651 m)   Wt 162 lb (73.5 kg)   SpO2 99%   BMI 26.96 kg/m  Physical Exam  Gen: WDWN NAD HEENT: NCAT, conjunctiva not injected, sclera nonicteric TM WNL B, OP moist, no exudates  NECK:  supple, no thyromegaly, no nodes, no carotid bruits CARDIAC: RRR, S1S2+, no murmur. DP 2+B LUNGS: CTAB. No wheezes ABDOMEN:  BS+, soft, NTND, No HSM, no masses EXT:  no edema MSK: no gross abnormalities. MS 5/5 all 4 NEURO: A&O x3.   CN II-XII intact.  PSYCH: normal mood. Good eye contact      Assessment and Plan   Health Maintenance counseling: 1. Anticipatory guidance: Patient counseled regarding regular dental exams q6 months, eye exams,  avoiding smoking and second hand smoke, limiting alcohol to 1 beverage per day, no illicit drugs.   2. Risk factor reduction:  Advised patient of need for regular exercise and diet rich and fruits and vegetables to reduce risk of heart attack and stroke. Exercise- +.  Wt Readings from Last 3 Encounters:  06/10/22 162 lb (73.5 kg) (89 %, Z= 1.22)*  05/08/22 145 lb (65.8 kg) (77 %, Z= 0.73)*  05/02/22 159 lb 9.6 oz (72.4 kg) (88 %, Z= 1.16)*   * Growth percentiles are based on CDC (Girls, 2-20 Years) data.   3. Immunizations/screenings/ancillary studies Immunization History  Administered Date(s) Administered   PFIZER(Purple Top)SARS-COV-2 Vaccination 06/01/2019, 06/27/2019, 03/06/2020   Tdap 09/14/2015   Health Maintenance Due  Topic Date Due   HIV Screening  Never done   Hepatitis C Screening  Never done    4. Cervical cancer screening: n/a 5. Skin cancer screening- advised regular sunscreen use. Denies worrisome, changing, or new skin lesions.  6. Birth control/STD check: n/a 7. Smoking associated screening: non smoker 8. Alcohol screening: non  Problem List Items Addressed This Visit   None   Recommended follow up: 1mNo follow-ups on file. No future appointments.   Lab/Order associations:n/a fasting No diagnosis found.  No orders of the defined types were placed in this encounter.    Angelena SoleAnn M Enijah Furr, MD

## 2022-06-24 ENCOUNTER — Other Ambulatory Visit: Payer: Self-pay

## 2022-07-08 ENCOUNTER — Other Ambulatory Visit (HOSPITAL_COMMUNITY): Payer: Self-pay

## 2022-07-08 ENCOUNTER — Ambulatory Visit: Payer: Commercial Managed Care - PPO | Admitting: Family Medicine

## 2022-07-08 ENCOUNTER — Encounter: Payer: Self-pay | Admitting: Family Medicine

## 2022-07-08 VITALS — BP 122/60 | HR 80 | Temp 99.0°F | Resp 18 | Ht 65.0 in | Wt 161.0 lb

## 2022-07-08 DIAGNOSIS — F419 Anxiety disorder, unspecified: Secondary | ICD-10-CM | POA: Diagnosis not present

## 2022-07-08 MED ORDER — ESCITALOPRAM OXALATE 20 MG PO TABS
20.0000 mg | ORAL_TABLET | Freq: Every day | ORAL | 1 refills | Status: DC
  Filled 2022-07-08: qty 90, 90d supply, fill #0

## 2022-07-08 NOTE — Progress Notes (Signed)
   Subjective:     Patient ID: Eileen Snyder, adult    DOB: 07-Nov-2000, 22 y.o.   MRN: [id number]  Chief Complaint  Patient presents with   Moods    4 week follow-up on moods     HPI Anxiety  Lexapro-helping  some.  No SI.  Took propranol before presentation-helped after.  So will take 1 heart rate prior.    Health Maintenance Due  Topic Date Due   HIV Screening  Never done   Hepatitis C Screening  Never done    Past Medical History:  Diagnosis Date   Allergy    Anxiety    Asthma    Headache    Migraine     Past Surgical History:  Procedure Laterality Date   Birthmark Removal     HIP ARTHROSCOPY Right 09/09/2021   LAPAROSCOPIC APPENDECTOMY N/A 06/06/2021   Procedure: APPENDECTOMY LAPAROSCOPIC;  Surgeon: Axel Filler, MD;  Location: [facility] OR;  Service: General;  Laterality: N/A;     Current Outpatient Medications:    albuterol (VENTOLIN HFA) 108 (90 Base) MCG/ACT inhaler, Inhale 2 puffs every 4-6 hours as needed for cough, wheezing, tightness in chest, or shortness of breath, Disp: 6.7 g, Rfl: 1   Atogepant (QULIPTA) 60 MG TABS, Take 1 tablet by mouth daily., Disp: 30 tablet, Rfl: 11   montelukast (SINGULAIR) 10 MG tablet, Take 1 tablet (10 mg total) by mouth at bedtime., Disp: 90 tablet, Rfl: 1   propranolol (INDERAL) 10 MG tablet, Take 1 tablet (10 mg total) by mouth 3 (three) times daily as needed., Disp: 30 tablet, Rfl: 1   escitalopram (LEXAPRO) 20 MG tablet, Take 1 tablet (20 mg total) by mouth daily., Disp: 90 tablet, Rfl: 1  Allergies  Allergen Reactions   Amoxicillin Hives, Rash and Anaphylaxis   ROS neg/noncontributory except as noted HPI/below      Objective:     BP 122/60   Pulse 80   Temp 99 F (37.2 C) (Temporal)   Resp 18   Ht 5\' 5"  (1.651 m)   Wt 161 lb (73 kg)   LMP 07/08/2022 (Exact Date)   SpO2 98%   BMI 26.79 kg/m  Wt Readings from Last 3 Encounters:  07/08/22 161 lb (73 kg) (88 %, Z= 1.19)*  06/10/22 162 lb (73.5 kg)  (89 %, Z= 1.22)*  05/08/22 145 lb (65.8 kg) (77 %, Z= 0.73)*   * Growth percentiles are based on CDC (Girls, 2-20 Years) data.    Physical Exam   Gen: WDWN NAD EXT:  no edema MSK: no gross abnormalities.  NEURO: A&O x3.  CN II-XII intact.  PSYCH: normal mood. Good eye contact     Assessment & Plan:  Anxiety  Other orders -     Escitalopram Oxalate; Take 1 tablet (20 mg total) by mouth daily.  Dispense: 90 tablet; Refill: 1   Anxiety-chronic.  Better controlled on Lexapro 10 mg but could be better.  Increase to 20 mg daily.  Propranolol 10 mg 1 hour prior to presentations.   Follow up 6 mo  Angelena Sole, MD

## 2022-07-08 NOTE — Patient Instructions (Signed)
It was very nice to see you today!  Increase Lexapro to 20 mg.   PLEASE NOTE:  If you had any lab tests please let us know if you have not heard back within a few days. You may see your results on MyChart before we have a chance to review them but we will give you a call once they are reviewed by [place]. If we ordered any referrals today, please let us know if you have not heard from their office within the next week.   Please try these tips to maintain a healthy lifestyle:  Eat most of your calories during the day when you are active. Eliminate processed foods including packaged sweets (pies, cakes, cookies), reduce intake of potatoes, white bread, white pasta, and white rice. Look for whole grain options, oat flour or almond flour.  Each meal should contain half fruits/vegetables, one quarter protein, and one quarter carbs (no bigger than a computer mouse).  Cut down on sweet beverages. This includes juice, soda, and sweet tea. Also watch fruit intake, though this is a healthier sweet option, it still contains natural sugar! Limit to 3 servings daily.  Drink at least 1 glass of water with each meal and aim for at least 8 glasses per day  Exercise at least 150 minutes every week.

## 2022-07-12 ENCOUNTER — Other Ambulatory Visit (HOSPITAL_COMMUNITY): Payer: Self-pay

## 2022-08-03 DIAGNOSIS — F411 Generalized anxiety disorder: Secondary | ICD-10-CM | POA: Diagnosis not present

## 2022-08-03 DIAGNOSIS — F909 Attention-deficit hyperactivity disorder, unspecified type: Secondary | ICD-10-CM | POA: Diagnosis not present

## 2022-08-05 ENCOUNTER — Encounter: Payer: Self-pay | Admitting: Family Medicine

## 2022-08-06 ENCOUNTER — Other Ambulatory Visit: Payer: Self-pay | Admitting: Family Medicine

## 2022-08-06 MED ORDER — ESCITALOPRAM OXALATE 10 MG PO TABS
15.0000 mg | ORAL_TABLET | Freq: Every day | ORAL | 1 refills | Status: DC
  Filled 2022-08-06: qty 135, 90d supply, fill #0
  Filled 2022-11-05: qty 135, 90d supply, fill #1

## 2022-08-08 ENCOUNTER — Other Ambulatory Visit (HOSPITAL_COMMUNITY): Payer: Self-pay

## 2022-08-08 ENCOUNTER — Other Ambulatory Visit: Payer: Self-pay

## 2022-08-25 ENCOUNTER — Other Ambulatory Visit (HOSPITAL_COMMUNITY): Payer: Self-pay

## 2022-09-05 DIAGNOSIS — F909 Attention-deficit hyperactivity disorder, unspecified type: Secondary | ICD-10-CM | POA: Diagnosis not present

## 2022-09-05 DIAGNOSIS — F411 Generalized anxiety disorder: Secondary | ICD-10-CM | POA: Diagnosis not present

## 2022-09-28 ENCOUNTER — Other Ambulatory Visit (HOSPITAL_COMMUNITY): Payer: Self-pay

## 2022-10-13 DIAGNOSIS — F909 Attention-deficit hyperactivity disorder, unspecified type: Secondary | ICD-10-CM | POA: Diagnosis not present

## 2022-10-13 DIAGNOSIS — F411 Generalized anxiety disorder: Secondary | ICD-10-CM | POA: Diagnosis not present

## 2022-10-16 DIAGNOSIS — H52222 Regular astigmatism, left eye: Secondary | ICD-10-CM | POA: Diagnosis not present

## 2022-10-19 ENCOUNTER — Other Ambulatory Visit (HOSPITAL_COMMUNITY): Payer: Self-pay

## 2022-11-08 ENCOUNTER — Other Ambulatory Visit (HOSPITAL_COMMUNITY): Payer: Self-pay

## 2022-11-08 ENCOUNTER — Other Ambulatory Visit: Payer: Self-pay

## 2022-11-09 ENCOUNTER — Other Ambulatory Visit (HOSPITAL_COMMUNITY): Payer: Self-pay

## 2022-11-09 ENCOUNTER — Other Ambulatory Visit: Payer: Self-pay

## 2022-11-11 DIAGNOSIS — F411 Generalized anxiety disorder: Secondary | ICD-10-CM | POA: Diagnosis not present

## 2022-11-11 DIAGNOSIS — F909 Attention-deficit hyperactivity disorder, unspecified type: Secondary | ICD-10-CM | POA: Diagnosis not present

## 2022-12-08 DIAGNOSIS — F909 Attention-deficit hyperactivity disorder, unspecified type: Secondary | ICD-10-CM | POA: Diagnosis not present

## 2022-12-08 DIAGNOSIS — F411 Generalized anxiety disorder: Secondary | ICD-10-CM | POA: Diagnosis not present

## 2022-12-20 ENCOUNTER — Telehealth: Payer: Self-pay

## 2022-12-20 NOTE — Telephone Encounter (Signed)
*  [facility]  Pharmacy Patient Advocate Encounter   Received notification from CoverMyMeds that prior authorization for Qulipta 60MG  tablets  is required/requested.   Insurance verification completed.   The patient is insured through [facility] .   Per test claim: PA required; PA submitted to [facility] via CoverMyMeds Key/confirmation #/EOC [id number] Status is pending

## 2022-12-21 DIAGNOSIS — F411 Generalized anxiety disorder: Secondary | ICD-10-CM | POA: Diagnosis not present

## 2022-12-21 DIAGNOSIS — F909 Attention-deficit hyperactivity disorder, unspecified type: Secondary | ICD-10-CM | POA: Diagnosis not present

## 2022-12-30 ENCOUNTER — Other Ambulatory Visit (HOSPITAL_COMMUNITY): Payer: Self-pay

## 2022-12-30 NOTE — Telephone Encounter (Signed)
Pharmacy Patient Advocate Encounter  Received notification from [facility] that Prior Authorization for Qulipta 60mg  has been APPROVED from 12/20/2022 to 12/19/2023. Ran test claim, Copay is $0.00. This test claim was processed through [facility]- copay amounts may vary at other pharmacies due to pharmacy/plan contracts, or as the patient moves through the different stages of their insurance plan.

## 2023-01-02 DIAGNOSIS — F909 Attention-deficit hyperactivity disorder, unspecified type: Secondary | ICD-10-CM | POA: Diagnosis not present

## 2023-01-02 DIAGNOSIS — F411 Generalized anxiety disorder: Secondary | ICD-10-CM | POA: Diagnosis not present

## 2023-01-06 ENCOUNTER — Encounter: Payer: Self-pay | Admitting: Family Medicine

## 2023-01-06 ENCOUNTER — Other Ambulatory Visit (HOSPITAL_COMMUNITY): Payer: Self-pay

## 2023-01-06 ENCOUNTER — Ambulatory Visit: Payer: Commercial Managed Care - PPO | Admitting: Family Medicine

## 2023-01-06 VITALS — BP 122/60 | HR 81 | Temp 99.2°F | Resp 18 | Ht 65.0 in | Wt 160.4 lb

## 2023-01-06 DIAGNOSIS — G43009 Migraine without aura, not intractable, without status migrainosus: Secondary | ICD-10-CM | POA: Diagnosis not present

## 2023-01-06 DIAGNOSIS — F419 Anxiety disorder, unspecified: Secondary | ICD-10-CM

## 2023-01-06 MED ORDER — ESCITALOPRAM OXALATE 10 MG PO TABS
15.0000 mg | ORAL_TABLET | Freq: Every day | ORAL | 1 refills | Status: DC
  Filled 2023-01-06 – 2023-02-27 (×2): qty 135, 90d supply, fill #0
  Filled 2023-06-11: qty 135, 90d supply, fill #1
  Filled 2023-06-12: qty 135, 90d supply, fill #0

## 2023-01-06 MED ORDER — QULIPTA 60 MG PO TABS
1.0000 | ORAL_TABLET | Freq: Every day | ORAL | 3 refills | Status: DC
  Filled 2023-01-06: qty 90, 90d supply, fill #0
  Filled 2023-04-17: qty 90, 90d supply, fill #1
  Filled 2023-07-20: qty 90, 90d supply, fill #2
  Filled 2023-10-16: qty 90, 90d supply, fill #3

## 2023-01-06 NOTE — Assessment & Plan Note (Signed)
Chronic.  Well-controlled with Qulipta 60 mg daily.  Renewed.  Occasional breakthrough.  Excedrin Migraine works

## 2023-01-06 NOTE — Progress Notes (Signed)
Subjective:     Patient ID: Eileen Snyder, adult    DOB: 2000-12-02, 22 y.o.   MRN: [id number]  Chief Complaint  Patient presents with   Follow-up    6 month follow-up on moods    HPI  Anxiety-doing well on 15mg  lexapro.  No SI.  Taking propranolol about once/month 2.  Migraine-rare aura, most w/o-doing well qlipta.  If misses doses, gets HA.  Occ breakthru. Excedrine works well.   Health Maintenance Due  Topic Date Due   HIV Screening  Never done   Hepatitis C Screening  Never done    Past Medical History:  Diagnosis Date   Allergy    Anxiety    Asthma    Headache    Migraine     Past Surgical History:  Procedure Laterality Date   Birthmark Removal     HIP ARTHROSCOPY Right 09/09/2021   LAPAROSCOPIC APPENDECTOMY N/A 06/06/2021   Procedure: APPENDECTOMY LAPAROSCOPIC;  Surgeon: Axel Filler, MD;  Location: [facility] OR;  Service: General;  Laterality: N/A;     Current Outpatient Medications:    albuterol (VENTOLIN HFA) 108 (90 Base) MCG/ACT inhaler, Inhale 2 puffs every 4-6 hours as needed for cough, wheezing, tightness in chest, or shortness of breath, Disp: 6.7 g, Rfl: 1   montelukast (SINGULAIR) 10 MG tablet, Take 1 tablet (10 mg total) by mouth at bedtime., Disp: 90 tablet, Rfl: 1   propranolol (INDERAL) 10 MG tablet, Take 1 tablet (10 mg total) by mouth 3 (three) times daily as needed., Disp: 30 tablet, Rfl: 1   Atogepant (QULIPTA) 60 MG TABS, Take 1 tablet by mouth daily., Disp: 90 tablet, Rfl: 3   escitalopram (LEXAPRO) 10 MG tablet, Take 1.5 tablets (15 mg total) by mouth daily., Disp: 135 tablet, Rfl: 1  Allergies  Allergen Reactions   Amoxicillin Hives, Rash and Anaphylaxis   ROS neg/noncontributory except as noted HPI/below Exercises.  No cp/sob Inquiring about testosterone therapy     Objective:     BP 122/60   Pulse 81   Temp 99.2 F (37.3 C) (Temporal)   Resp 18   Ht 5\' 5"  (1.651 m)   Wt 160 lb 6 oz (72.7 kg)   LMP 01/05/2023 (Exact Date)    SpO2 99%   BMI 26.69 kg/m  Wt Readings from Last 3 Encounters:  01/06/23 160 lb 6 oz (72.7 kg) (87%, Z= 1.14)*  07/08/22 161 lb (73 kg) (88%, Z= 1.19)*  06/10/22 162 lb (73.5 kg) (89%, Z= 1.22)*   * Growth percentiles are based on CDC (Girls, 2-20 Years) data.    Physical Exam   Gen: WDWN NAD HEENT: NCAT, conjunctiva not injected, sclera nonicteric NECK:  supple, no thyromegaly, no nodes, no carotid bruits CARDIAC: RRR, S1S2+, no murmur. DP 2+B LUNGS: CTAB. No wheezes ABDOMEN:  BS+, soft, NTND, No HSM, no masses EXT:  no edema MSK: no gross abnormalities.  NEURO: A&O x3.  CN II-XII intact.  PSYCH: normal mood. Good eye contact     Assessment & Plan:  Anxiety Assessment & Plan: Chronic.  Well-controlled.  Continue Lexapro 15 mg daily.  Propranolol 10 mg 3 times daily as needed (uses rarely)  Orders: -     Escitalopram Oxalate; Take 1.5 tablets (15 mg total) by mouth daily.  Dispense: 135 tablet; Refill: 1  Migraine without aura and without status migrainosus, not intractable Assessment & Plan: Chronic.  Well-controlled with Qulipta 60 mg daily.  Renewed.  Occasional breakthrough.  Excedrin Migraine works  Orders: Bennie Pierini; Take 1 tablet by mouth daily.  Dispense: 90 tablet; Refill: 3    Return in about 6 months (around 07/06/2023) for annual physical.  Angelena Sole, MD

## 2023-01-06 NOTE — Patient Instructions (Signed)

## 2023-01-06 NOTE — Assessment & Plan Note (Signed)
Chronic.  Well-controlled.  Continue Lexapro 15 mg daily.  Propranolol 10 mg 3 times daily as needed (uses rarely)

## 2023-01-09 ENCOUNTER — Encounter (HOSPITAL_COMMUNITY): Payer: Self-pay

## 2023-01-09 ENCOUNTER — Other Ambulatory Visit (HOSPITAL_COMMUNITY): Payer: Self-pay

## 2023-01-11 ENCOUNTER — Ambulatory Visit: Payer: Commercial Managed Care - PPO | Admitting: Family Medicine

## 2023-01-13 ENCOUNTER — Other Ambulatory Visit (HOSPITAL_COMMUNITY): Payer: Self-pay

## 2023-01-13 ENCOUNTER — Ambulatory Visit: Payer: Commercial Managed Care - PPO | Admitting: Family Medicine

## 2023-01-13 DIAGNOSIS — F649 Gender identity disorder, unspecified: Secondary | ICD-10-CM | POA: Diagnosis not present

## 2023-01-13 MED ORDER — "HYPODERMIC NEEDLE 25G X 5/8"" MISC"
1.0000 | 0 refills | Status: DC
  Filled 2023-01-13: qty 20, 20d supply, fill #0
  Filled 2023-01-30: qty 20, 140d supply, fill #0

## 2023-01-13 MED ORDER — TESTOSTERONE CYPIONATE 200 MG/ML IM SOLN
40.0000 mg | INTRAMUSCULAR | 0 refills | Status: DC
  Filled 2023-01-13: qty 1, 7d supply, fill #0
  Filled 2023-01-16: qty 1, 7d supply, fill #1
  Filled 2023-01-23: qty 1, 28d supply, fill #1
  Filled 2023-01-30: qty 11, 77d supply, fill #1

## 2023-01-13 MED ORDER — "HYPODERMIC NEEDLE 18G X 1"" MISC"
1.0000 | 0 refills | Status: DC
  Filled 2023-01-30: qty 20, 140d supply, fill #0

## 2023-01-13 MED ORDER — EASY TOUCH SYRINGE BARREL 1ML MISC
1.0000 | 0 refills | Status: DC
  Filled 2023-01-30: qty 20, 140d supply, fill #0

## 2023-01-16 ENCOUNTER — Other Ambulatory Visit (HOSPITAL_COMMUNITY): Payer: Self-pay

## 2023-01-16 DIAGNOSIS — F909 Attention-deficit hyperactivity disorder, unspecified type: Secondary | ICD-10-CM | POA: Diagnosis not present

## 2023-01-16 DIAGNOSIS — F411 Generalized anxiety disorder: Secondary | ICD-10-CM | POA: Diagnosis not present

## 2023-01-19 ENCOUNTER — Telehealth: Payer: Self-pay

## 2023-01-19 NOTE — Telephone Encounter (Signed)
*  [facility]  Pharmacy Patient Advocate Encounter   Received notification from CoverMyMeds that prior authorization for Ubrelvy 100MG  tablets  is required/requested.   Insurance verification completed.   The patient is insured through [facility] .   Per test claim: PA required; PA submitted to above mentioned insurance via CoverMyMeds Key/confirmation #/EOC [id number] Status is pending

## 2023-01-23 ENCOUNTER — Other Ambulatory Visit (HOSPITAL_COMMUNITY): Payer: Self-pay

## 2023-01-25 ENCOUNTER — Other Ambulatory Visit (HOSPITAL_COMMUNITY): Payer: Self-pay

## 2023-01-25 NOTE — Telephone Encounter (Signed)
Pharmacy Patient Advocate Encounter  Received notification from [facility] that Prior Authorization for Ubrelvy 100mg  tablets has been APPROVED from 01/23/2023 to 01/22/2024. Ran test claim, Copay is $0.00. This test claim was processed through [facility]- copay amounts may vary at other pharmacies due to pharmacy/plan contracts, or as the patient moves through the different stages of their insurance plan.   PA #/Case ID/Reference #: [id number]

## 2023-01-30 ENCOUNTER — Other Ambulatory Visit: Payer: Self-pay | Admitting: Family

## 2023-01-30 ENCOUNTER — Other Ambulatory Visit: Payer: Self-pay

## 2023-01-30 ENCOUNTER — Other Ambulatory Visit (HOSPITAL_COMMUNITY): Payer: Self-pay

## 2023-02-01 ENCOUNTER — Other Ambulatory Visit (HOSPITAL_COMMUNITY): Payer: Self-pay

## 2023-02-01 ENCOUNTER — Telehealth: Payer: Self-pay | Admitting: Family

## 2023-02-01 MED ORDER — MONTELUKAST SODIUM 10 MG PO TABS
10.0000 mg | ORAL_TABLET | Freq: Every day | ORAL | 0 refills | Status: DC
  Filled 2023-02-01: qty 30, 30d supply, fill #0

## 2023-02-01 NOTE — Telephone Encounter (Signed)
Courtesy refill has been sent.

## 2023-02-01 NOTE — Telephone Encounter (Signed)
Eileen Snyder called in because her Montelukast was denied.  Artemisa made an appointment for Dec 10th.  Can we please send in a Montelukast as a courtesy because she only has 2 pills left.  Charlynne Pander uses [facility]

## 2023-02-08 DIAGNOSIS — F411 Generalized anxiety disorder: Secondary | ICD-10-CM | POA: Diagnosis not present

## 2023-02-08 DIAGNOSIS — F909 Attention-deficit hyperactivity disorder, unspecified type: Secondary | ICD-10-CM | POA: Diagnosis not present

## 2023-02-14 ENCOUNTER — Ambulatory Visit: Payer: Commercial Managed Care - PPO | Admitting: Family

## 2023-02-27 ENCOUNTER — Other Ambulatory Visit (HOSPITAL_COMMUNITY): Payer: Self-pay

## 2023-02-27 DIAGNOSIS — F909 Attention-deficit hyperactivity disorder, unspecified type: Secondary | ICD-10-CM | POA: Diagnosis not present

## 2023-02-27 DIAGNOSIS — F411 Generalized anxiety disorder: Secondary | ICD-10-CM | POA: Diagnosis not present

## 2023-03-06 ENCOUNTER — Other Ambulatory Visit: Payer: Self-pay | Admitting: Family

## 2023-03-06 ENCOUNTER — Other Ambulatory Visit (HOSPITAL_COMMUNITY): Payer: Self-pay

## 2023-03-06 MED ORDER — MONTELUKAST SODIUM 10 MG PO TABS
10.0000 mg | ORAL_TABLET | Freq: Every day | ORAL | 0 refills | Status: DC
  Filled 2023-03-06: qty 30, 30d supply, fill #0

## 2023-03-09 ENCOUNTER — Other Ambulatory Visit: Payer: Self-pay

## 2023-03-09 ENCOUNTER — Other Ambulatory Visit (HOSPITAL_COMMUNITY): Payer: Self-pay

## 2023-03-09 ENCOUNTER — Ambulatory Visit: Payer: Commercial Managed Care - PPO | Admitting: Family Medicine

## 2023-03-09 ENCOUNTER — Encounter: Payer: Self-pay | Admitting: Family Medicine

## 2023-03-09 VITALS — BP 112/58 | HR 86 | Temp 98.6°F | Resp 16 | Wt 166.5 lb

## 2023-03-09 DIAGNOSIS — J4531 Mild persistent asthma with (acute) exacerbation: Secondary | ICD-10-CM

## 2023-03-09 DIAGNOSIS — J302 Other seasonal allergic rhinitis: Secondary | ICD-10-CM | POA: Diagnosis not present

## 2023-03-09 DIAGNOSIS — J3089 Other allergic rhinitis: Secondary | ICD-10-CM | POA: Diagnosis not present

## 2023-03-09 MED ORDER — MONTELUKAST SODIUM 10 MG PO TABS
10.0000 mg | ORAL_TABLET | Freq: Every day | ORAL | 5 refills | Status: DC
  Filled 2023-03-09 – 2023-04-17 (×2): qty 30, 30d supply, fill #0
  Filled 2023-05-15: qty 30, 30d supply, fill #1
  Filled 2023-06-16: qty 30, 30d supply, fill #2
  Filled 2023-07-20: qty 30, 30d supply, fill #3
  Filled 2023-08-28: qty 30, 30d supply, fill #4
  Filled 2023-09-28: qty 30, 30d supply, fill #5

## 2023-03-09 MED ORDER — CETIRIZINE HCL 10 MG PO TABS
10.0000 mg | ORAL_TABLET | Freq: Every day | ORAL | 5 refills | Status: AC
  Filled 2023-03-09: qty 30, 30d supply, fill #0
  Filled 2023-08-28: qty 30, 30d supply, fill #1
  Filled 2023-10-16 – 2024-02-04 (×2): qty 30, 30d supply, fill #2
  Filled 2024-03-07: qty 30, 30d supply, fill #3

## 2023-03-09 NOTE — Progress Notes (Signed)
 [address] [place] [address] Dept: [phone]  FOLLOW UP NOTE  Patient ID: Eileen Snyder, adult    DOB: May 11, 2000  Age: 23 y.o. MRN: [id number] Date of Office Visit: 03/09/2023  Assessment  Chief Complaint: Follow-up  HPI Eileen Snyder is a 23 year old patient who presents to the clinic for follow-up visit.  They were last seen in this clinic on 05/02/2022 by Wanda Craze, FNP, for evaluation of asthma with acute exacerbation requiring prednisone .    At today's visit, they report that their asthma has been well controlled while at school [facility] [employer], however when they return home for a short break they occasionally experience shortness of breath for which albuterol  is used with relief of symptoms.   Allergic rhinitis is reported as well controlled with symptoms only occurring when home on break. They report there are several cats and dogs at home. They occasionally take Benadryl after symptoms begin. They continue montelukast  daily and are not using a daily antihistamine, nasal saline rinse, or nasal steroid spray. Their last environmental allergy skin testing was on 02/18/2021 was positive to grass pollen, tree pollen, dust mite, cat, dog, and horse.  Their current medications are listed in the chart.  Drug Allergies:  Allergies  Allergen Reactions   Amoxicillin Hives, Rash and Anaphylaxis    Physical Exam: BP (!) 112/58   Pulse 86   Temp 98.6 F (37 C) (Temporal)   Resp 16   Wt 166 lb 8 oz (75.5 kg)   SpO2 97%   BMI 27.71 kg/m    Physical Exam Vitals reviewed.  Constitutional:      Appearance: Normal appearance.  HENT:     Head: Normocephalic and atraumatic.     Right Ear: Tympanic membrane normal.     Left Ear: Tympanic membrane normal.     Nose:     Comments: Bilateral nares slightly erythematous with thin clear nasal drainage noted. Pharynx normal. Ears normal. Eyes normal.    Mouth/Throat:     Pharynx: Oropharynx is clear.  Eyes:      Conjunctiva/sclera: Conjunctivae normal.  Cardiovascular:     Rate and Rhythm: Normal rate and regular rhythm.     Heart sounds: Normal heart sounds. No murmur heard. Pulmonary:     Effort: Pulmonary effort is normal.     Breath sounds: Normal breath sounds.     Comments: Lungs clear to asucultation Musculoskeletal:        General: Normal range of motion.     Cervical back: Normal range of motion and neck supple.  Skin:    General: Skin is warm and dry.  Neurological:     Mental Status: Zamorah is alert and oriented to person, place, and time.  Psychiatric:        Mood and Affect: Mood normal.        Behavior: Behavior normal.        Thought Content: Thought content normal.        Judgment: Judgment normal.    Assessment and Plan: 1. Mild persistent asthma with (acute) exacerbation   2. Seasonal and perennial allergic rhinitis     Meds ordered this encounter  Medications   montelukast  (SINGULAIR ) 10 MG tablet    Sig: Take 1 tablet (10 mg total) by mouth at bedtime.    Dispense:  30 tablet    Refill:  5   cetirizine  (ZYRTEC ) 10 MG tablet    Sig: Take 1 tablet (10 mg total) by mouth  daily.    Dispense:  30 tablet    Refill:  5    Patient Instructions  Asthma Continue montelukast  10 mg once a day to prevent cough or wheeze Continue albuterol  2 puffs once every 4 hours if needed for cough or wheeze Continue albuterol  2 puffs 5 to 15 minutes before activity to decrease cough or wheeze  Allergic rhinitis Continue allergen avoidance measures directed toward grass pollen, tree pollen, dust mite with cat, dog, and horse as listed below Continue montelukast  10 mg once a day to decrease allergy symptoms Continue an antihistamine once a day as needed for a runny nose or itch. Remember to rotate to a different antihistamine about every 3 months. Some examples of over the counter antihistamines include Zyrtec  (cetirizine ), Xyzal (levocetirizine), Allegra (fexofenadine), and Claritin  (loratidine).  Continue Flonase  2 sprays in each nostril once a day as needed for a stuffy nose.  In the right nostril, point the applicator out toward the right ear. In the left nostril, point the applicator out toward the left ear Consider saline nasal rinses as needed for nasal symptoms. Use this before any medicated nasal sprays for best result Consider allergen immunotherapy if your symptoms are not well-controlled with the treatment plan as listed above Begin an antihistamine once a day about 2 days before you leave college to come home and continue taking one antihistamine once a day while you are at home.   Call the clinic if this treatment plan is not working well for you.  Follow up in 6 months or sooner if needed.   Return in about 6 months (around 09/06/2023), or if symptoms worsen or fail to improve.    Thank you for the opportunity to care for this patient.  Please do not hesitate to contact me with questions.  Arlean Mutter, FNP [facility] 

## 2023-03-09 NOTE — Patient Instructions (Addendum)
 Asthma Continue montelukast  10 mg once a day to prevent cough or wheeze Continue albuterol  2 puffs once every 4 hours if needed for cough or wheeze Continue albuterol  2 puffs 5 to 15 minutes before activity to decrease cough or wheeze  Allergic rhinitis Continue allergen avoidance measures directed toward grass pollen, tree pollen, dust mite with cat, dog, and horse as listed below Continue montelukast  10 mg once a day to decrease allergy symptoms Continue an antihistamine once a day as needed for a runny nose or itch. Remember to rotate to a different antihistamine about every 3 months. Some examples of over the counter antihistamines include Zyrtec  (cetirizine ), Xyzal (levocetirizine), Allegra (fexofenadine), and Claritin (loratidine).  Continue Flonase  2 sprays in each nostril once a day as needed for a stuffy nose.  In the right nostril, point the applicator out toward the right ear. In the left nostril, point the applicator out toward the left ear Consider saline nasal rinses as needed for nasal symptoms. Use this before any medicated nasal sprays for best result Consider allergen immunotherapy if your symptoms are not well-controlled with the treatment plan as listed above Begin an antihistamine once a day about 2 days before you leave college to come home and continue taking one antihistamine once a day while you are at home.   Call the clinic if this treatment plan is not working well for you.  Follow up in 6 months or sooner if needed.  Reducing Pollen Exposure The American Academy of Allergy, Asthma and Immunology suggests the following steps to reduce your exposure to pollen during allergy seasons. Do not hang sheets or clothing out to dry; pollen may collect on these items. Do not mow lawns or spend time around freshly cut grass; mowing stirs up pollen. Keep windows closed at night.  Keep car windows closed while driving. Minimize morning activities outdoors, a time when pollen  counts are usually at their highest. Stay indoors as much as possible when pollen counts or humidity is high and on windy days when pollen tends to remain in the air longer. Use air conditioning when possible.  Many air conditioners have filters that trap the pollen spores. Use a HEPA room air filter to remove pollen form the indoor air you breathe.   Control of Dust Mite Allergen Dust mites play a major role in allergic asthma and rhinitis. They occur in environments with high humidity wherever human skin is found. Dust mites absorb humidity from the atmosphere (ie, they do not drink) and feed on organic matter (including shed human and animal skin). Dust mites are a microscopic type of insect that you cannot see with the naked eye. High levels of dust mites have been detected from mattresses, pillows, carpets, upholstered furniture, bed covers, clothes, soft toys and any woven material. The principal allergen of the dust mite is found in its feces. A gram of dust may contain 1,000 mites and 250,000 fecal particles. Mite antigen is easily measured in the air during house cleaning activities. Dust mites do not bite and do not cause harm to humans, other than by triggering allergies/asthma.  Ways to decrease your exposure to dust mites in your home:  1. Encase mattresses, box springs and pillows with a mite-impermeable barrier or cover  2. Wash sheets, blankets and drapes weekly in hot water (130 F) with detergent and dry them in a dryer on the hot setting.  3. Have the room cleaned frequently with a vacuum cleaner and a damp dust-mop. For  carpeting or rugs, vacuuming with a vacuum cleaner equipped with a high-efficiency particulate air (HEPA) filter. The dust mite allergic individual should not be in a room which is being cleaned and should wait 1 hour after cleaning before going into the room.  4. Do not sleep on upholstered furniture (eg, couches).  5. If possible removing carpeting,  upholstered furniture and drapery from the home is ideal. Horizontal blinds should be eliminated in the rooms where the person spends the most time (bedroom, study, television room). Washable vinyl, roller-type shades are optimal.  6. Remove all non-washable stuffed toys from the bedroom. Wash stuffed toys weekly like sheets and blankets above.  7. Reduce indoor humidity to less than 50%. Inexpensive humidity monitors can be purchased at most hardware stores. Do not use a humidifier as can make the problem worse and are not recommended.  Control of Dog or Cat Allergen Avoidance is the best way to manage a dog or cat allergy. If you have a dog or cat and are allergic to dog or cats, consider removing the dog or cat from the home. If you have a dog or cat but don't want to find it a new home, or if your family wants a pet even though someone in the household is allergic, here are some strategies that may help keep symptoms at bay:  Keep the pet out of your bedroom and restrict it to only a few rooms. Be advised that keeping the dog or cat in only one room will not limit the allergens to that room. Don't pet, hug or kiss the dog or cat; if you do, wash your hands with soap and water. High-efficiency particulate air (HEPA) cleaners run continuously in a bedroom or living room can reduce allergen levels over time. Regular use of a high-efficiency vacuum cleaner or a central vacuum can reduce allergen levels. Giving your dog or cat a bath at least once a week can reduce airborne allergen.

## 2023-03-20 DIAGNOSIS — F411 Generalized anxiety disorder: Secondary | ICD-10-CM | POA: Diagnosis not present

## 2023-03-20 DIAGNOSIS — F909 Attention-deficit hyperactivity disorder, unspecified type: Secondary | ICD-10-CM | POA: Diagnosis not present

## 2023-04-17 ENCOUNTER — Other Ambulatory Visit (HOSPITAL_COMMUNITY): Payer: Self-pay

## 2023-04-17 DIAGNOSIS — F909 Attention-deficit hyperactivity disorder, unspecified type: Secondary | ICD-10-CM | POA: Diagnosis not present

## 2023-04-17 DIAGNOSIS — F411 Generalized anxiety disorder: Secondary | ICD-10-CM | POA: Diagnosis not present

## 2023-04-18 ENCOUNTER — Other Ambulatory Visit (HOSPITAL_COMMUNITY): Payer: Self-pay

## 2023-04-18 ENCOUNTER — Other Ambulatory Visit: Payer: Self-pay

## 2023-05-03 ENCOUNTER — Other Ambulatory Visit (HOSPITAL_COMMUNITY): Payer: Self-pay

## 2023-05-03 DIAGNOSIS — F649 Gender identity disorder, unspecified: Secondary | ICD-10-CM | POA: Diagnosis not present

## 2023-05-03 MED ORDER — "HYPODERMIC NEEDLE 25G X 5/8"" MISC"
0 refills | Status: DC
  Filled 2023-05-03: qty 20, 140d supply, fill #0
  Filled 2023-05-15: qty 12, 84d supply, fill #0
  Filled 2023-09-04 – 2023-09-05 (×3): qty 8, 56d supply, fill #1

## 2023-05-03 MED ORDER — "HYPODERMIC NEEDLE 18G X 1"" MISC"
0 refills | Status: DC
  Filled 2023-05-03: qty 20, 140d supply, fill #0
  Filled 2023-05-15: qty 12, 84d supply, fill #0
  Filled 2023-09-04: qty 8, 56d supply, fill #1
  Filled 2023-10-16: qty 8, 60d supply, fill #1
  Filled 2023-11-19 – 2023-11-20 (×2): qty 8, 56d supply, fill #1

## 2023-05-03 MED ORDER — TESTOSTERONE ENANTHATE 200 MG/ML IM SOLN
60.0000 mg | INTRAMUSCULAR | 2 refills | Status: DC
  Filled 2023-05-03 – 2023-05-15 (×2): qty 5, 28d supply, fill #0
  Filled 2023-06-26: qty 5, 28d supply, fill #1

## 2023-05-03 MED ORDER — EASY TOUCH SYRINGE BARREL 1ML MISC
0 refills | Status: AC
  Filled 2023-05-03: qty 20, 140d supply, fill #0
  Filled 2023-05-15: qty 12, 28d supply, fill #0

## 2023-05-08 DIAGNOSIS — F411 Generalized anxiety disorder: Secondary | ICD-10-CM | POA: Diagnosis not present

## 2023-05-15 ENCOUNTER — Other Ambulatory Visit (HOSPITAL_COMMUNITY): Payer: Self-pay

## 2023-05-15 ENCOUNTER — Other Ambulatory Visit: Payer: Self-pay

## 2023-05-16 ENCOUNTER — Other Ambulatory Visit: Payer: Self-pay

## 2023-05-21 DIAGNOSIS — S8264XA Nondisplaced fracture of lateral malleolus of right fibula, initial encounter for closed fracture: Secondary | ICD-10-CM | POA: Diagnosis not present

## 2023-05-29 DIAGNOSIS — S93431A Sprain of tibiofibular ligament of right ankle, initial encounter: Secondary | ICD-10-CM | POA: Diagnosis not present

## 2023-05-30 DIAGNOSIS — F411 Generalized anxiety disorder: Secondary | ICD-10-CM | POA: Diagnosis not present

## 2023-06-12 ENCOUNTER — Other Ambulatory Visit (HOSPITAL_COMMUNITY): Payer: Self-pay

## 2023-06-14 DIAGNOSIS — S93431D Sprain of tibiofibular ligament of right ankle, subsequent encounter: Secondary | ICD-10-CM | POA: Diagnosis not present

## 2023-06-16 ENCOUNTER — Other Ambulatory Visit (HOSPITAL_COMMUNITY): Payer: Self-pay

## 2023-06-23 DIAGNOSIS — F411 Generalized anxiety disorder: Secondary | ICD-10-CM | POA: Diagnosis not present

## 2023-06-24 DIAGNOSIS — S93431D Sprain of tibiofibular ligament of right ankle, subsequent encounter: Secondary | ICD-10-CM | POA: Diagnosis not present

## 2023-06-26 ENCOUNTER — Other Ambulatory Visit: Payer: Self-pay

## 2023-06-26 ENCOUNTER — Other Ambulatory Visit (HOSPITAL_COMMUNITY): Payer: Self-pay

## 2023-06-28 DIAGNOSIS — S93431D Sprain of tibiofibular ligament of right ankle, subsequent encounter: Secondary | ICD-10-CM | POA: Diagnosis not present

## 2023-07-04 DIAGNOSIS — F411 Generalized anxiety disorder: Secondary | ICD-10-CM | POA: Diagnosis not present

## 2023-07-11 ENCOUNTER — Other Ambulatory Visit (HOSPITAL_COMMUNITY): Payer: Self-pay

## 2023-07-11 ENCOUNTER — Encounter: Payer: Self-pay | Admitting: Family Medicine

## 2023-07-11 ENCOUNTER — Ambulatory Visit (INDEPENDENT_AMBULATORY_CARE_PROVIDER_SITE_OTHER): Payer: Commercial Managed Care - PPO | Admitting: Family Medicine

## 2023-07-11 VITALS — BP 118/70 | HR 79 | Temp 98.9°F | Resp 18 | Ht 65.0 in | Wt 169.0 lb

## 2023-07-11 DIAGNOSIS — Z131 Encounter for screening for diabetes mellitus: Secondary | ICD-10-CM | POA: Diagnosis not present

## 2023-07-11 DIAGNOSIS — Z0000 Encounter for general adult medical examination without abnormal findings: Secondary | ICD-10-CM

## 2023-07-11 DIAGNOSIS — F419 Anxiety disorder, unspecified: Secondary | ICD-10-CM

## 2023-07-11 MED ORDER — ESCITALOPRAM OXALATE 10 MG PO TABS
15.0000 mg | ORAL_TABLET | Freq: Every day | ORAL | 1 refills | Status: DC
  Filled 2023-07-11 – 2023-09-14 (×2): qty 135, 90d supply, fill #0
  Filled 2023-12-12: qty 135, 90d supply, fill #1

## 2023-07-11 NOTE — Progress Notes (Signed)
 Phone [phone]   Subjective:   Patient is a 23 y.o. adult presenting for annual physical.    Chief Complaint  Patient presents with   Annual Exam    CPE    Annual-female partner for 4 yrs.  On testosterone  for several months.   See problem oriented charting- ROS- ROS: Gen: no fever, chills  Skin: no rash, itching ENT: no ear pain, ear drainage, nasal congestion, rhinorrhea, sinus pressure, sore throat-some allergies Eyes: no blurry vision, double vision Resp: no cough, wheeze,SOB CV: no CP, palpitations, LE edema,  GI: no heartburn, n/v/d/c, abd pain GU: no dysuria, urgency, frequency, hematuria.  Lmp 3 mo ago-on testosterone  MSK: R ankle sprain 2.5 mo ago-saw ortho.  PT Neuro: no dizziness, headache, weakness, vertigo Psych: no depression, anxiety, insomnia, SI   The following were reviewed and entered/updated in epic: Past Medical History:  Diagnosis Date   Allergy    Anxiety    Asthma    Headache    Migraine    Patient Active Problem List   Diagnosis Date Noted   Mild persistent asthma with (acute) exacerbation 03/09/2023   Anxiety 06/10/2022   Seasonal and perennial allergic rhinitis 02/19/2021   Food intolerance 02/19/2021   Migraine with aura and without status migrainosus, not intractable 02/08/2021   Migraine variant 05/13/2019   Tremor of both hands 02/11/2019   Temporomandibular joint dysfunction syndrome 02/22/2016   Episodic tension type headache 02/22/2016   Migraine without aura and without status migrainosus, not intractable 02/22/2016   Family history of migraine 02/22/2016   Past Surgical History:  Procedure Laterality Date   Birthmark Removal     HIP ARTHROSCOPY Right 09/09/2021   LAPAROSCOPIC APPENDECTOMY N/A 06/06/2021   Procedure: APPENDECTOMY LAPAROSCOPIC;  Surgeon: Shela Derby, MD;  Location: [facility] OR;  Service: General;  Laterality: N/A;    Family History  Problem Relation Age of Onset   Depression Mother    Asthma Mother     Migraines Mother    Hypertension Father    Asthma Sister    Miscarriages / Stillbirths Maternal Grandmother    Heart disease Maternal Grandmother    COPD Maternal Grandmother    Asthma Maternal Grandmother    Cancer Maternal Grandfather        throat, colon   Alcohol abuse Maternal Grandfather    Hypertension Paternal Grandmother    Asthma Paternal Grandmother    Cancer Paternal Grandfather     Medications- reviewed and updated Current Outpatient Medications  Medication Sig Dispense Refill   albuterol  (VENTOLIN  HFA) 108 (90 Base) MCG/ACT inhaler Inhale 2 puffs every 4-6 hours as needed for cough, wheezing, tightness in chest, or shortness of breath 6.7 g 1   Atogepant  (QULIPTA ) 60 MG TABS Take 1 tablet by mouth daily. 90 tablet 3   cetirizine  (ZYRTEC ) 10 MG tablet Take 1 tablet (10 mg total) by mouth daily. 30 tablet 5   MAGNESIUM PO Take by mouth.     montelukast  (SINGULAIR ) 10 MG tablet Take 1 tablet (10 mg total) by mouth at bedtime. 30 tablet 5   Needle, Disp, (HYPODERMIC NEEDLE 18GX1") 18G X 1" MISC Use for weekly hormone injections as directed 20 each 0   Needle, Disp, (HYPODERMIC NEEDLE 25GX5/8") 25G X 5/8" MISC Use for weekly hormone injections as directed. 20 each 0   Needles & Syringes (EASY TOUCH SYRINGE BARREL ) MISC Use for weekly hormone injections as directed 20 each 0   propranolol  (INDERAL ) 10 MG tablet Take 1 tablet (10  mg total) by mouth 3 (three) times daily as needed. 30 tablet 1   testosterone  enanthate (DELATESTRYL ) 200 MG/ML injection Inject 0.3 mLs (60 mg total) into the muscle every 7 (seven) days. (discard vial 28 days after initial puncture) 5 mL 2   escitalopram  (LEXAPRO ) 10 MG tablet Take 1.5 tablets (15 mg total) by mouth daily. 135 tablet 1   No current facility-administered medications for this visit.    Allergies-reviewed and updated Allergies  Allergen Reactions   Amoxicillin Hives, Rash and Anaphylaxis    Social History   Social  History Narrative   Materials engineer sciences   Objective  Objective:  BP 118/70   Pulse 79   Temp 98.9 F (37.2 C) (Temporal)   Resp 18   Ht 5\' 5"  (1.651 m)   Wt 169 lb (76.7 kg)   LMP  (Within Months)   SpO2 96%   BMI 28.12 kg/m  Physical Exam  Gen: WDWN NAD HEENT: NCAT, conjunctiva not injected, sclera nonicteric TM WNL B, OP moist, no exudates  NECK:  supple, no thyromegaly, no nodes, no carotid bruits CARDIAC: RRR, S1S2+, no murmur. DP 2+B LUNGS: CTAB. No wheezes ABDOMEN:  BS+, soft, NTND, No HSM, no masses EXT:  no edema MSK: no gross abnormalities. MS 5/5 all 4.  Ankle brace on R NEURO: A&O x3.  CN II-XII intact.  PSYCH: normal mood. Good eye contact      Assessment and Plan   Health Maintenance counseling: 1. Anticipatory guidance: Patient counseled regarding regular dental exams q6 months, eye exams,  avoiding smoking and second hand smoke, limiting alcohol to 1 beverage per day, no illicit drugs.   2. Risk factor reduction:  Advised patient of need for regular exercise and diet rich and fruits and vegetables to reduce risk of heart attack and stroke. Exercise- +.  Wt Readings from Last 3 Encounters:  07/11/23 169 lb (76.7 kg)  03/09/23 166 lb 8 oz (75.5 kg)  01/06/23 160 lb 6 oz (72.7 kg) (87%, Z= 1.14)*   * Growth percentiles are based on CDC (Girls, 2-20 Years) data.   3. Immunizations/screenings/ancillary studies Immunization History  Administered Date(s) Administered   DTaP 03/28/2007   Dtap, Unspecified 04/16/2003, 06/09/2003, 08/21/2003, 09/01/2004, 03/28/2007   Fluzone Influenza virus vaccine,trivalent (IIV3), split virus 02/08/2008   HIB, Unspecified 04/16/2003, 06/09/2003, 08/21/2003, 09/01/2004   Hep B, Unspecified 2002-12-14, 04/16/2003, 06/09/2003, 08/21/2003   Hepatitis A, Ped/Adol-2 Dose 02/21/2014, 12/21/2016   IPV 03/28/2007   Influenza, Seasonal, Injecte, Preservative Fre 02/10/2009, 12/15/2009, 02/25/2011, 11/17/2011    Influenza,inj,Quad PF,6+ Mos 01/30/2013, 12/25/2013, 01/20/2015, 12/21/2015, 12/21/2016, 03/16/2018, 01/21/2019, 01/31/2020, 12/30/2020   Influenza-Unspecified 02/19/2004, 03/23/2005, 12/06/2005, 02/07/2007   MMR 02/19/2004, 03/28/2007   MenQuadfi_Meningococcal Groups ACYW Conjugate 09/18/2020   Meningococcal B, OMV 09/18/2020, 10/21/2020   Meningococcal Mcv4o 09/14/2015   PFIZER(Purple Top)SARS-COV-2 Vaccination 06/01/2019, 06/27/2019, 03/06/2020   Pneumococcal-Unspecified 04/16/2003, 08/21/2003, 11/13/2003, 09/01/2004   Polio, Unspecified 04/16/2003, 06/09/2003, 08/21/2003, 03/28/2007   Tdap 09/14/2015   Varicella 02/19/2004, 03/28/2007   There are no preventive care reminders to display for this patient.   4. Cervical cancer screening: n/a 5. Skin cancer screening- advised regular sunscreen use. Denies worrisome, changing, or new skin lesions.  6. Birth control/STD check: n/a 7. Smoking associated screening: non smoker 8. Alcohol screening: neg  Wellness examination -     Lipid panel -     Comprehensive metabolic panel with GFR -     CBC with Differential/Platelet -     Hemoglobin A1c -  TSH  Anxiety -     Escitalopram  Oxalate; Take 1.5 tablets (15 mg total) by mouth daily.  Dispense: 135 tablet; Refill: 1  Wellness-anticipatory guidance.  Work on Diet/Exercise  Check CBC,CMP,lipids,TSH, A1C.  F/u 1 yr  Anxiety-chronic.  Controlled.  Continue lexapro  15mg  daily  Recommended follow up: Return in about 6 months (around 01/11/2024) for chronic follow-up.  Lab/Order associations:3 hrs ago-smoothie fasting   Ellsworth Haas, MD

## 2023-07-11 NOTE — Patient Instructions (Signed)

## 2023-07-12 ENCOUNTER — Encounter: Payer: Self-pay | Admitting: Family Medicine

## 2023-07-12 LAB — CBC WITH DIFFERENTIAL/PLATELET
Basophils Absolute: 0.1 10*3/uL (ref 0.0–0.1)
Basophils Relative: 0.9 % (ref 0.0–3.0)
Eosinophils Absolute: 1.1 10*3/uL — ABNORMAL HIGH (ref 0.0–0.7)
Eosinophils Relative: 11.7 % — ABNORMAL HIGH (ref 0.0–5.0)
HCT: 43.3 % (ref 36.0–46.0)
Hemoglobin: 14.2 g/dL (ref 12.0–15.0)
Lymphocytes Relative: 27.1 % (ref 12.0–46.0)
Lymphs Abs: 2.5 10*3/uL (ref 0.7–4.0)
MCHC: 32.8 g/dL (ref 30.0–36.0)
MCV: 79.8 fl (ref 78.0–100.0)
Monocytes Absolute: 0.7 10*3/uL (ref 0.1–1.0)
Monocytes Relative: 7.4 % (ref 3.0–12.0)
Neutro Abs: 4.9 10*3/uL (ref 1.4–7.7)
Neutrophils Relative %: 52.9 % (ref 43.0–77.0)
Platelets: 286 10*3/uL (ref 150.0–400.0)
RBC: 5.42 Mil/uL — ABNORMAL HIGH (ref 3.87–5.11)
RDW: 14.5 % (ref 11.5–14.6)
WBC: 9.3 10*3/uL (ref 4.5–10.5)

## 2023-07-12 LAB — TSH: TSH: 0.99 u[IU]/mL (ref 0.35–5.50)

## 2023-07-12 LAB — LIPID PANEL
Cholesterol: 170 mg/dL (ref 0–200)
HDL: 47.6 mg/dL (ref >39.00)
LDL Cholesterol: 98 mg/dL (ref 0–99)
NonHDL: 121.96
Total CHOL/HDL Ratio: 4
Triglycerides: 121 mg/dL (ref 0.0–149.0)
VLDL: 24.2 mg/dL (ref 0.0–40.0)

## 2023-07-12 LAB — COMPREHENSIVE METABOLIC PANEL WITH GFR
ALT: 22 U/L (ref 0–35)
AST: 40 U/L — ABNORMAL HIGH (ref 0–37)
Albumin: 4.9 g/dL (ref 3.5–5.2)
Alkaline Phosphatase: 81 U/L (ref 39–117)
BUN: 16 mg/dL (ref 6–23)
CO2: 27 meq/L (ref 19–32)
Calcium: 9.7 mg/dL (ref 8.4–10.5)
Chloride: 101 meq/L (ref 96–112)
Creatinine, Ser: 0.87 mg/dL (ref 0.40–1.20)
GFR: 95.91 mL/min (ref >60.00)
Glucose, Bld: 62 mg/dL — ABNORMAL LOW (ref 70–99)
Potassium: 4.1 meq/L (ref 3.5–5.1)
Sodium: 138 meq/L (ref 135–145)
Total Bilirubin: 0.4 mg/dL (ref 0.2–1.2)
Total Protein: 7.8 g/dL (ref 6.0–8.3)

## 2023-07-12 LAB — HEMOGLOBIN A1C: Hgb A1c MFr Bld: 5.8 % (ref 4.6–6.5)

## 2023-07-12 NOTE — Progress Notes (Signed)
 Labs great except 1.  A1C(3 month average of sugars) is elevated.  This is considered PreDiabetes.  Work on diet-decrease sugars and starches and aim for 30 minutes of exercise 5 days/week to prevent diabetes.  Sugar was low-could be from specimen sitting or from insulin impaired.  Need to continue healthy diet/exercise 2.  Liver test slightly elevated-could be from sugars, testosterone , other. Repeat lft 1-2 mo 3.  Eos high-could be allergies-repeat w/lft's

## 2023-07-13 ENCOUNTER — Other Ambulatory Visit: Payer: Self-pay | Admitting: *Deleted

## 2023-07-13 DIAGNOSIS — R898 Other abnormal findings in specimens from other organs, systems and tissues: Secondary | ICD-10-CM

## 2023-07-13 DIAGNOSIS — R7989 Other specified abnormal findings of blood chemistry: Secondary | ICD-10-CM

## 2023-07-25 ENCOUNTER — Other Ambulatory Visit: Payer: Self-pay | Admitting: Family

## 2023-07-25 ENCOUNTER — Other Ambulatory Visit (HOSPITAL_COMMUNITY): Payer: Self-pay

## 2023-07-25 MED ORDER — ALBUTEROL SULFATE HFA 108 (90 BASE) MCG/ACT IN AERS
2.0000 | INHALATION_SPRAY | RESPIRATORY_TRACT | 1 refills | Status: AC
  Filled 2023-07-25: qty 6.7, 25d supply, fill #0

## 2023-08-01 ENCOUNTER — Other Ambulatory Visit (HOSPITAL_COMMUNITY): Payer: Self-pay

## 2023-08-01 MED ORDER — TESTOSTERONE ENANTHATE 200 MG/ML IM SOLN
60.0000 mg | INTRAMUSCULAR | 0 refills | Status: DC
  Filled 2023-08-01: qty 5, 28d supply, fill #0
  Filled 2023-09-04: qty 5, 28d supply, fill #1
  Filled 2023-10-16: qty 5, 28d supply, fill #2

## 2023-08-02 ENCOUNTER — Other Ambulatory Visit (HOSPITAL_COMMUNITY): Payer: Self-pay

## 2023-08-03 ENCOUNTER — Ambulatory Visit: Admitting: Family Medicine

## 2023-08-15 ENCOUNTER — Ambulatory Visit: Admitting: Physical Therapy

## 2023-08-23 ENCOUNTER — Other Ambulatory Visit: Payer: Self-pay

## 2023-08-23 ENCOUNTER — Ambulatory Visit: Attending: Orthopedic Surgery

## 2023-08-23 DIAGNOSIS — R262 Difficulty in walking, not elsewhere classified: Secondary | ICD-10-CM | POA: Insufficient documentation

## 2023-08-23 DIAGNOSIS — M25571 Pain in right ankle and joints of right foot: Secondary | ICD-10-CM | POA: Diagnosis not present
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
## 2023-08-28 ENCOUNTER — Ambulatory Visit: Admitting: Physical Therapy

## 2023-08-28 ENCOUNTER — Encounter: Payer: Self-pay | Admitting: Physical Therapy

## 2023-08-28 ENCOUNTER — Other Ambulatory Visit (HOSPITAL_COMMUNITY): Payer: Self-pay

## 2023-08-28 DIAGNOSIS — M25571 Pain in right ankle and joints of right foot: Secondary | ICD-10-CM | POA: Diagnosis not present

## 2023-08-28 DIAGNOSIS — R262 Difficulty in walking, not elsewhere classified: Secondary | ICD-10-CM | POA: Diagnosis not present
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
## 2023-09-04 ENCOUNTER — Other Ambulatory Visit: Payer: Self-pay

## 2023-09-04 ENCOUNTER — Other Ambulatory Visit (HOSPITAL_COMMUNITY): Payer: Self-pay

## 2023-09-05 ENCOUNTER — Other Ambulatory Visit (HOSPITAL_COMMUNITY): Payer: Self-pay

## 2023-09-05 MED ORDER — BD LUER-LOK SYRINGE 18G X 1-1/2" 3 ML MISC
0 refills | Status: DC
  Filled 2023-09-05: qty 8, 56d supply, fill #0

## 2023-09-06 ENCOUNTER — Ambulatory Visit: Attending: Orthopedic Surgery

## 2023-09-06 DIAGNOSIS — M25571 Pain in right ankle and joints of right foot: Secondary | ICD-10-CM | POA: Insufficient documentation

## 2023-09-06 DIAGNOSIS — R262 Difficulty in walking, not elsewhere classified: Secondary | ICD-10-CM | POA: Diagnosis not present
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
## 2023-09-07 ENCOUNTER — Ambulatory Visit: Payer: Commercial Managed Care - PPO | Admitting: Allergy & Immunology

## 2023-09-14 ENCOUNTER — Ambulatory Visit

## 2023-09-14 DIAGNOSIS — R262 Difficulty in walking, not elsewhere classified: Secondary | ICD-10-CM | POA: Diagnosis not present

## 2023-09-14 DIAGNOSIS — M25571 Pain in right ankle and joints of right foot: Secondary | ICD-10-CM | POA: Diagnosis not present
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
## 2023-09-15 ENCOUNTER — Other Ambulatory Visit: Payer: Self-pay

## 2023-09-15 ENCOUNTER — Other Ambulatory Visit (HOSPITAL_COMMUNITY): Payer: Self-pay

## 2023-09-20 ENCOUNTER — Ambulatory Visit: Admitting: Physical Therapy

## 2023-09-20 ENCOUNTER — Encounter: Payer: Self-pay | Admitting: Physical Therapy

## 2023-09-20 DIAGNOSIS — M25571 Pain in right ankle and joints of right foot: Secondary | ICD-10-CM | POA: Diagnosis not present

## 2023-09-20 DIAGNOSIS — R262 Difficulty in walking, not elsewhere classified: Secondary | ICD-10-CM
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
## 2023-09-21 ENCOUNTER — Ambulatory Visit: Admitting: Allergy & Immunology
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
## 2023-09-29 ENCOUNTER — Ambulatory Visit: Payer: Self-pay | Admitting: Physical Therapy

## 2023-09-29 ENCOUNTER — Encounter: Payer: Self-pay | Admitting: Physical Therapy

## 2023-09-29 DIAGNOSIS — M25571 Pain in right ankle and joints of right foot: Secondary | ICD-10-CM | POA: Diagnosis not present

## 2023-09-29 DIAGNOSIS — R262 Difficulty in walking, not elsewhere classified: Secondary | ICD-10-CM | POA: Diagnosis not present
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
## 2023-10-04 ENCOUNTER — Ambulatory Visit: Payer: Self-pay | Admitting: Physical Therapy

## 2023-10-04 ENCOUNTER — Encounter: Payer: Self-pay | Admitting: Physical Therapy

## 2023-10-04 DIAGNOSIS — M25571 Pain in right ankle and joints of right foot: Secondary | ICD-10-CM

## 2023-10-04 DIAGNOSIS — R262 Difficulty in walking, not elsewhere classified: Secondary | ICD-10-CM | POA: Diagnosis not present

## 2023-10-11 ENCOUNTER — Ambulatory Visit: Payer: Self-pay | Attending: Orthopedic Surgery

## 2023-10-11 DIAGNOSIS — M25571 Pain in right ankle and joints of right foot: Secondary | ICD-10-CM | POA: Insufficient documentation

## 2023-10-11 DIAGNOSIS — R262 Difficulty in walking, not elsewhere classified: Secondary | ICD-10-CM | POA: Diagnosis not present
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
## 2023-10-12 ENCOUNTER — Other Ambulatory Visit (HOSPITAL_COMMUNITY): Payer: Self-pay

## 2023-10-12 ENCOUNTER — Encounter: Payer: Self-pay | Admitting: Allergy & Immunology

## 2023-10-12 ENCOUNTER — Other Ambulatory Visit: Payer: Self-pay

## 2023-10-12 ENCOUNTER — Ambulatory Visit: Admitting: Allergy & Immunology

## 2023-10-12 VITALS — BP 102/60 | HR 95 | Temp 98.7°F | Resp 18 | Ht 65.5 in | Wt 175.7 lb

## 2023-10-12 DIAGNOSIS — J3089 Other allergic rhinitis: Secondary | ICD-10-CM

## 2023-10-12 DIAGNOSIS — J302 Other seasonal allergic rhinitis: Secondary | ICD-10-CM

## 2023-10-12 DIAGNOSIS — J4530 Mild persistent asthma, uncomplicated: Secondary | ICD-10-CM | POA: Diagnosis not present

## 2023-10-12 MED ORDER — AIRSUPRA 90-80 MCG/ACT IN AERO
2.0000 | INHALATION_SPRAY | RESPIRATORY_TRACT | 3 refills | Status: DC | PRN
  Filled 2023-10-12: qty 10.7, 30d supply, fill #0
  Filled 2024-01-01: qty 10.7, 30d supply, fill #1
  Filled 2024-03-07: qty 10.7, 30d supply, fill #2

## 2023-10-12 NOTE — Progress Notes (Signed)
 FOLLOW UP  Date of Service/Encounter:  10/12/23   Assessment:   Seasonal and perennial allergic rhinitis (grasses, trees, dust mites, cat, dog, and horse)   Food intolerance (red dye)    Migraines   ADHD    History of asthma - with normal spirometry at the first visit  Plan/Recommendations:   Patient Instructions  1. Seasonal and perennial allergic rhinitis (grasses, trees, dust mites, cat, dog, and horse) - I think we can just hold off on any changes at this time since you are going back so soon.  - Continue taking: Singulair  (montelukast ) 10mg  daily and Zyrtec  (cetirizine ) 10mg  daily - You can use an extra cetirizine  if you need to during the bad days.  - Consider nasal saline rinses 1-2 times daily to remove allergens from the nasal cavities as well as help with mucous clearance (this is especially helpful to do before the nasal sprays are given) - I agree that starting shots would be useless now since you are mostly without symptoms when you are at school.   2. Mild persistent asthma. uncomplicated - Lung testing looked great today.  - We are going to AirSupra , which contains albuterol  combined with an inhaled steroid. - This leads to more efficient use of the albuterol  by your lungs AND decreased length of needing the albuterol  overall. - There is also a copay card that should bring it down to as low as $0.  - Spacer use reviewed. - Daily controller medication(s): Singulair  10mg  daily - Prior to physical activity: AirSupra  2 puffs 10-15 minutes before physical activity. - Rescue medications: AirSupra  2 puffs every 4-6 hours as needed - Asthma control goals:  * Full participation in all desired activities (may need albuterol  before activity) * Albuterol  use two time or less a week on average (not counting use with activity) * Cough interfering with sleep two time or less a month * Oral steroids no more than once a year * No hospitalizations  3. Return in about 1  year (around 10/11/2024). You can have the follow up appointment with Dr. Iva or a Nurse Practicioner (our Nurse Practitioners are excellent and always have Physician oversight!).    Please inform us  of any Emergency Department visits, hospitalizations, or changes in symptoms. Call us  before going to the ED for breathing or allergy symptoms since we might be able to fit you in for a sick visit. Feel free to contact us  anytime with any questions, problems, or concerns.  It was a pleasure to see you and your family again today!  Websites that have reliable patient information: 1. American Academy of Asthma, Allergy, and Immunology: www.aaaai.org 2. Food Allergy Research and Education (FARE): foodallergy.org 3. Mothers of Asthmatics: http://www.asthmacommunitynetwork.org 4. American College of Allergy, Asthma, and Immunology: www.acaai.org      "Like" us  on Facebook and Instagram for our latest updates!      A healthy democracy works best when [employer] participate! Make sure you are registered to vote! If you have moved or changed any of your contact information, you will need to get this updated before voting! Scan the QR codes below to learn more!                  Subjective:   Eileen Snyder is a 23 y.o. adult presenting today for follow up of  Chief Complaint  Patient presents with   Allergic Rhinitis     Was having issues when returned from college. Issues caused by the  animals in the home. Has improved due to the duration.   Asthma    Had a few flare ups when first came home. Settled down since being home.     Eileen Snyder has a history of the following: Patient Active Problem List   Diagnosis Date Noted   Mild persistent asthma with (acute) exacerbation 03/09/2023   Anxiety 06/10/2022   Seasonal and perennial allergic rhinitis 02/19/2021   Food intolerance 02/19/2021   Migraine with aura and without status migrainosus, not intractable 02/08/2021    Migraine variant 05/13/2019   Tremor of both hands 02/11/2019   Temporomandibular joint dysfunction syndrome 02/22/2016   Episodic tension type headache 02/22/2016   Migraine without aura and without status migrainosus, not intractable 02/22/2016   Family history of migraine 02/22/2016    History obtained from: chart review and patient.  Discussed the use of AI scribe software for clinical note transcription with the patient and/or guardian, who gave verbal consent to proceed.  Eileen Snyder is a 23 y.o. adult presenting for a follow up visit.  They were last seen in January 2025.  At that time, we continued with montelukast  as well as albuterol  for their asthma.  For their allergic rhinitis, we continue with montelukast  as well as alternating antihistamines.  They also remained on the Flonase  and nasal saline rinses for their allergic rhinitis symptoms.  Since last visit, they have done well.  They experience allergy flare-ups attributed to exposure to a pit bull at their house. Symptoms worsen when in the same room with the dog for extended periods, necessitating the use of an inhaler. They manage their allergies with Singulair  and Zyrtec , which have been helpful, but avoid nasal sprays due to adverse effects like headaches.  They have both respiratory and nasal symptoms when exposed to the dog and have not been on allergy shots. Occasional exercise-induced asthma is noted, more noticeable this summer, possibly related to pre-workout supplements causing increased heart rate and breathing difficulties. Episodes of chest tightness and difficulty breathing improve after calming down. Currently, they use an albuterol  inhaler as needed, particularly when at home, and have not used Qvar in a long time.  Regarding migraines, they report improvement and are currently taking Qulipta  daily, with Excedrin as needed for acute episodes. No recent sinus or ear infections are noted.  They are a Consulting civil engineer at [employer],  living in a dormitory, and have a girlfriend who recently transferred to [employer]. They have been together for nearly four years, having met in high school. They are majoring in [employer] and plan to spend a semester at a coastal facility associated with their university. They have a history of an appendectomy following an episode of appendicitis while visiting [facility]. They took this as a sign to NOT attend [employer].     Otherwise, there have been no changes to Eileen Snyder's past medical history, surgical history, family history, or social history.    Review of systems otherwise negative other than that mentioned in the HPI.    Objective:   Blood pressure 102/60, pulse 95, temperature 98.7 F (37.1 C), temperature source Temporal, resp. rate 18, height 5' 5.5 (1.664 m), weight 175 lb 11.2 oz (79.7 kg), SpO2 99%. Body mass index is 28.79 kg/m.    Physical Exam Constitutional:      Appearance: Eileen Snyder is well-developed.     Comments: Pleasant and smiling.   HENT:     Head: Normocephalic and atraumatic.     Right Ear:  Tympanic membrane, ear canal and external ear normal.     Left Ear: Tympanic membrane, ear canal and external ear normal.     Nose: No nasal deformity, septal deviation, mucosal edema or rhinorrhea.     Right Turbinates: Enlarged, swollen and pale.     Left Turbinates: Enlarged, swollen and pale.     Right Sinus: No maxillary sinus tenderness or frontal sinus tenderness.     Left Sinus: No maxillary sinus tenderness or frontal sinus tenderness.     Mouth/Throat:     Mouth: Mucous membranes are not pale and not dry.     Pharynx: Uvula midline.  Eyes:     General: Lids are normal. No allergic shiner.       Right eye: No discharge.        Left eye: No discharge.     Conjunctiva/sclera: Conjunctivae normal.     Right eye: Right conjunctiva is not injected. No chemosis.    Left eye: Left conjunctiva is not injected. No chemosis.    Pupils: Pupils are equal,  round, and reactive to light.  Cardiovascular:     Rate and Rhythm: Normal rate and regular rhythm.     Heart sounds: Normal heart sounds.  Pulmonary:     Effort: Pulmonary effort is normal. No tachypnea, accessory muscle usage or respiratory distress.     Breath sounds: Normal breath sounds. No wheezing, rhonchi or rales.  Chest:     Chest wall: No tenderness.  Lymphadenopathy:     Cervical: No cervical adenopathy.  Skin:    Coloration: Skin is not pale.     Findings: No abrasion, erythema, petechiae or rash. Rash is not papular, urticarial or vesicular.  Neurological:     Mental Status: Eileen Snyder is alert.  Psychiatric:        Behavior: Behavior is cooperative.      Diagnostic studies:    Spirometry: results normal (FEV1: 3.14/90%, FVC: 4.14/103%, FEV1/FVC: 76%).    Spirometry consistent with normal pattern.   Allergy Studies: none      Marty Shaggy, MD  [facility] 

## 2023-10-12 NOTE — Patient Instructions (Addendum)
 1. Seasonal and perennial allergic rhinitis (grasses, trees, dust mites, cat, dog, and horse) - I think we can just hold off on any changes at this time since you are going back so soon.  - Continue taking: Singulair  (montelukast ) 10mg  daily and Zyrtec  (cetirizine ) 10mg  daily - You can use an extra cetirizine  if you need to during the bad days.  - Consider nasal saline rinses 1-2 times daily to remove allergens from the nasal cavities as well as help with mucous clearance (this is especially helpful to do before the nasal sprays are given) - I agree that starting shots would be useless now since you are mostly without symptoms when you are at school.   2. Mild persistent asthma. uncomplicated - Lung testing looked great today.  - We are going to AirSupra , which contains albuterol  combined with an inhaled steroid. - This leads to more efficient use of the albuterol  by your lungs AND decreased length of needing the albuterol  overall. - There is also a copay card that should bring it down to as low as $0.  - Spacer use reviewed. - Daily controller medication(s): Singulair  10mg  daily - Prior to physical activity: AirSupra  2 puffs 10-15 minutes before physical activity. - Rescue medications: AirSupra  2 puffs every 4-6 hours as needed - Asthma control goals:  * Full participation in all desired activities (may need albuterol  before activity) * Albuterol  use two time or less a week on average (not counting use with activity) * Cough interfering with sleep two time or less a month * Oral steroids no more than once a year * No hospitalizations  3. Return in about 1 year (around 10/11/2024). You can have the follow up appointment with Dr. Iva or a Nurse Practicioner (our Nurse Practitioners are excellent and always have Physician oversight!).    Please inform us  of any Emergency Department visits, hospitalizations, or changes in symptoms. Call us  before going to the ED for breathing or allergy  symptoms since we might be able to fit you in for a sick visit. Feel free to contact us  anytime with any questions, problems, or concerns.  It was a pleasure to see you and your family again today!  Websites that have reliable patient information: 1. American Academy of Asthma, Allergy, and Immunology: www.aaaai.org 2. Food Allergy Research and Education (FARE): foodallergy.org 3. Mothers of Asthmatics: http://www.asthmacommunitynetwork.org 4. American College of Allergy, Asthma, and Immunology: www.acaai.org      "Like" us  on Facebook and Instagram for our latest updates!      A healthy democracy works best when [employer] participate! Make sure you are registered to vote! If you have moved or changed any of your contact information, you will need to get this updated before voting! Scan the QR codes below to learn more!

## 2023-10-13 ENCOUNTER — Ambulatory Visit: Admitting: Physical Therapy

## 2023-10-13 ENCOUNTER — Encounter: Payer: Self-pay | Admitting: Physical Therapy

## 2023-10-13 DIAGNOSIS — R262 Difficulty in walking, not elsewhere classified: Secondary | ICD-10-CM

## 2023-10-13 DIAGNOSIS — M25571 Pain in right ankle and joints of right foot: Secondary | ICD-10-CM | POA: Diagnosis not present
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
## 2023-10-16 ENCOUNTER — Other Ambulatory Visit (HOSPITAL_COMMUNITY): Payer: Self-pay

## 2023-10-17 ENCOUNTER — Other Ambulatory Visit: Payer: Self-pay

## 2023-10-17 ENCOUNTER — Other Ambulatory Visit (HOSPITAL_COMMUNITY): Payer: Self-pay
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
## 2023-10-19 ENCOUNTER — Ambulatory Visit

## 2023-10-19 DIAGNOSIS — R262 Difficulty in walking, not elsewhere classified: Secondary | ICD-10-CM

## 2023-10-19 DIAGNOSIS — M25571 Pain in right ankle and joints of right foot: Secondary | ICD-10-CM | POA: Diagnosis not present

## 2023-11-19 ENCOUNTER — Other Ambulatory Visit: Payer: Self-pay | Admitting: Family Medicine

## 2023-11-19 ENCOUNTER — Other Ambulatory Visit (HOSPITAL_COMMUNITY): Payer: Self-pay

## 2023-11-20 ENCOUNTER — Other Ambulatory Visit: Payer: Self-pay

## 2023-11-20 ENCOUNTER — Other Ambulatory Visit (HOSPITAL_COMMUNITY): Payer: Self-pay

## 2023-11-20 MED ORDER — "BD DISP NEEDLES 25G X 5/8"" MISC"
1.0000 | 0 refills | Status: AC
  Filled 2023-11-20: qty 20, 140d supply, fill #0
  Filled 2023-11-20: qty 8, 56d supply, fill #0
  Filled 2024-03-13: qty 8, 56d supply, fill #1
  Filled 2024-03-13: qty 12, 84d supply, fill #0
  Filled 2024-03-25: qty 8, 56d supply, fill #0

## 2023-11-20 MED ORDER — MONTELUKAST SODIUM 10 MG PO TABS
10.0000 mg | ORAL_TABLET | Freq: Every day | ORAL | 5 refills | Status: DC
  Filled 2023-11-20: qty 30, 30d supply, fill #0
  Filled 2024-01-01: qty 30, 30d supply, fill #1
  Filled 2024-02-04: qty 30, 30d supply, fill #2
  Filled 2024-03-07: qty 30, 30d supply, fill #3

## 2023-11-20 MED ORDER — BD LUER-LOK SYRINGE 18G X 1-1/2" 3 ML MISC
1.0000 | 0 refills | Status: AC
  Filled 2023-11-20: qty 8, 56d supply, fill #0

## 2023-11-21 ENCOUNTER — Other Ambulatory Visit (HOSPITAL_COMMUNITY): Payer: Self-pay

## 2023-11-22 ENCOUNTER — Other Ambulatory Visit (HOSPITAL_COMMUNITY): Payer: Self-pay

## 2023-11-22 ENCOUNTER — Other Ambulatory Visit: Payer: Self-pay

## 2023-11-22 MED ORDER — TESTOSTERONE ENANTHATE 200 MG/ML IM SOLN
60.0000 mg | INTRAMUSCULAR | 0 refills | Status: DC
  Filled 2023-11-22 (×2): qty 5, 28d supply, fill #0
  Filled 2024-01-01: qty 5, 28d supply, fill #1

## 2023-11-23 ENCOUNTER — Other Ambulatory Visit (HOSPITAL_COMMUNITY): Payer: Self-pay

## 2023-11-24 ENCOUNTER — Other Ambulatory Visit: Payer: Self-pay

## 2023-11-24 ENCOUNTER — Other Ambulatory Visit (HOSPITAL_COMMUNITY): Payer: Self-pay

## 2023-11-24 ENCOUNTER — Encounter (HOSPITAL_COMMUNITY): Payer: Self-pay

## 2023-12-04 DIAGNOSIS — R059 Cough, unspecified: Secondary | ICD-10-CM | POA: Diagnosis not present

## 2023-12-12 ENCOUNTER — Other Ambulatory Visit (HOSPITAL_COMMUNITY): Payer: Self-pay

## 2023-12-26 ENCOUNTER — Encounter: Payer: Self-pay | Admitting: *Deleted

## 2024-01-02 ENCOUNTER — Other Ambulatory Visit (HOSPITAL_COMMUNITY): Payer: Self-pay

## 2024-01-02 ENCOUNTER — Other Ambulatory Visit: Payer: Self-pay

## 2024-01-03 ENCOUNTER — Other Ambulatory Visit: Payer: Self-pay

## 2024-01-16 ENCOUNTER — Encounter: Payer: Self-pay | Admitting: Family Medicine

## 2024-01-16 ENCOUNTER — Telehealth (INDEPENDENT_AMBULATORY_CARE_PROVIDER_SITE_OTHER): Admitting: Family Medicine

## 2024-01-16 ENCOUNTER — Other Ambulatory Visit (HOSPITAL_COMMUNITY): Payer: Self-pay

## 2024-01-16 VITALS — Wt 175.0 lb

## 2024-01-16 DIAGNOSIS — G43009 Migraine without aura, not intractable, without status migrainosus: Secondary | ICD-10-CM

## 2024-01-16 DIAGNOSIS — F419 Anxiety disorder, unspecified: Secondary | ICD-10-CM

## 2024-01-16 DIAGNOSIS — J0100 Acute maxillary sinusitis, unspecified: Secondary | ICD-10-CM

## 2024-01-16 DIAGNOSIS — J4531 Mild persistent asthma with (acute) exacerbation: Secondary | ICD-10-CM

## 2024-01-16 MED ORDER — AZITHROMYCIN 250 MG PO TABS
ORAL_TABLET | ORAL | 0 refills | Status: DC
  Filled 2024-01-16: qty 6, 5d supply, fill #0

## 2024-01-16 MED ORDER — AZITHROMYCIN 250 MG PO TABS: ORAL_TABLET | ORAL | 0 refills | Status: AC

## 2024-01-16 MED ORDER — QULIPTA 60 MG PO TABS
1.0000 | ORAL_TABLET | Freq: Every day | ORAL | 3 refills | Status: AC
  Filled 2024-01-16 – 2024-02-26 (×3): qty 90, 90d supply, fill #0

## 2024-01-16 MED ORDER — ESCITALOPRAM OXALATE 10 MG PO TABS
15.0000 mg | ORAL_TABLET | Freq: Every day | ORAL | 1 refills | Status: AC
  Filled 2024-01-16 – 2024-03-13 (×3): qty 135, 90d supply, fill #0

## 2024-01-16 NOTE — Patient Instructions (Signed)

## 2024-01-16 NOTE — Progress Notes (Signed)
 MyChart Video Visit Virtual Visit via Video Note   This visit type was conducted w/patient consent. This format is felt to be most appropriate for this patient at this time. Physical exam was limited by quality of the video and audio technology used for the visit. CMA was able to get the patient set up on a video visit.  Patient location: Home([place]). Patient and provider in visit Provider location: Office  I discussed the limitations of evaluation and management by telemedicine and the availability of in person appointments. The patient expressed understanding and agreed to proceed.  Visit Date: 01/16/2024  Today's healthcare provider: Jenkins CHRISTELLA Carrel, MD     Subjective:    Patient ID: Eileen Snyder, adult    DOB: 2000/10/25, 22 y.o.   MRN: [id number]  Chief Complaint  Patient presents with   Anxiety    Pt to discuss chronic issues, pt has not been feeling well (cold for about a week), also has not had a period since January    Discussed the use of AI scribe software for clinical note transcription with the patient, who gave verbal consent to proceed.  History of Present Illness Eileen Snyder is a 23 year old with depression, anxiety, migraines, and asthma who presents for follow-up.  For migraines, they take Qulipta  daily as a maintenance medication, which has significantly reduced the frequency of headaches to a handful per month. On days with a headache, they take Excedrin, which is effective.  Regarding mood, they take Lexapro  15 mg daily, effectively managing depression and anxiety. No thoughts of suicide. Propranolol  is used as needed for anxiety, particularly around exams, approximately twice a month, but not during the summer.  They have asthma managed with albuterol  and montelukast . No maintenance inhaler is used. Recently, there has been increased use of albuterol  due to symptoms of a possible sinus infection, including coughing up dark green mucus and nasal  congestion.  They report feeling under the weather for the past week, with symptoms resembling a sinus infection, including a persistent headache, cough, and nasal congestion. DayQuil and Nyquil have been used without significant relief. No fevers experienced.  They are allergic to amoxicillin, which causes hives and anaphylaxis, and avoid cephalosporins due to this allergy.  They are a consulting civil engineer at [employer], currently studying for an organic chemistry exam. Plans to be home in June and looking for an apartment in [place] in July.    Past Medical History:  Diagnosis Date   Allergy    Anxiety    Asthma    Headache    Migraine     Past Surgical History:  Procedure Laterality Date   Birthmark Removal     HIP ARTHROSCOPY Right 09/09/2021   LAPAROSCOPIC APPENDECTOMY N/A 06/06/2021   Procedure: APPENDECTOMY LAPAROSCOPIC;  Surgeon: Rubin Calamity, MD;  Location: [facility] OR;  Service: General;  Laterality: N/A;    Outpatient Medications Prior to Visit  Medication Sig Dispense Refill   albuterol  (VENTOLIN  HFA) 108 (90 Base) MCG/ACT inhaler Inhale 2 puffs every 4-6 hours as needed for cough, wheezing, tightness in chest, or shortness of breath 6.7 g 1   Albuterol -Budesonide  (AIRSUPRA ) 90-80 MCG/ACT AERO Inhale 2 puffs into the lungs every 4 (four) hours as needed. 10.7 g 3   cetirizine  (ZYRTEC ) 10 MG tablet Take 1 tablet (10 mg total) by mouth daily. 30 tablet 5   MAGNESIUM PO Take by mouth.     montelukast  (SINGULAIR ) 10 MG tablet Take 1 tablet (10 mg total)  by mouth at bedtime. 30 tablet 5   NEEDLE, DISP, 25 G (BD DISP NEEDLES) 25G X 5/8 MISC Use for weekly hormone injections as directed. 20 each 0   Needles & Syringes (EASY TOUCH SYRINGE BARREL ) MISC Use for weekly hormone injections as directed 20 each 0   propranolol  (INDERAL ) 10 MG tablet Take 1 tablet (10 mg total) by mouth 3 (three) times daily as needed. 30 tablet 1   SYRINGE-NEEDLE, DISP, 3 ML (B-D 3CC LUER-LOK SYR 18GX1-1/2) 18G  X 1-1/2 3 ML MISC Use for weekly hormone injections as directed. 8 each 0   testosterone  enanthate (DELATESTRYL ) 200 MG/ML injection Inject 0.3 mLs (60 mg total) into the shoulder, thigh or buttocks once a week. (vial good for 28 days from date of inital puncture) 10 mL 0   Atogepant  (QULIPTA ) 60 MG TABS Take 1 tablet by mouth daily. 90 tablet 3   escitalopram  (LEXAPRO ) 10 MG tablet Take 1.5 tablets (15 mg total) by mouth daily. 135 tablet 1   No facility-administered medications prior to visit.    Allergies  Allergen Reactions   Amoxicillin Hives, Rash and Anaphylaxis        Objective:     Physical Exam  Vitals and nursing note reviewed.  Constitutional:      General:  is not in acute distress.    Appearance: Normal appearance.  HENT:     Head: Normocephalic.  Pulmonary:     Effort: No respiratory distress.  Skin:    General: Skin is dry.     Coloration: Skin is not pale.  Neurological:     Mental Status: Pt is alert and oriented to person, place, and time.  Psychiatric:        Mood and Affect: Mood normal.   Wt 175 lb (79.4 kg)   LMP  (LMP Unknown)   BMI 28.68 kg/m   Wt Readings from Last 3 Encounters:  01/16/24 175 lb (79.4 kg)  10/12/23 175 lb 11.2 oz (79.7 kg)  07/11/23 169 lb (76.7 kg)       Assessment & Plan:   Problem List Items Addressed This Visit     Migraine without aura and without status migrainosus, not intractable - Primary (Chronic)   Relevant Medications   Atogepant  (QULIPTA ) 60 MG TABS   escitalopram  (LEXAPRO ) 10 MG tablet   Anxiety   Relevant Medications   escitalopram  (LEXAPRO ) 10 MG tablet   Mild persistent asthma with (acute) exacerbation   Other Visit Diagnoses       Acute non-recurrent maxillary sinusitis       Relevant Medications   azithromycin  (ZITHROMAX ) 250 MG tablet       Assessment and Plan Assessment & Plan Acute sinusitis with upper respiratory symptoms   Symptoms include headache, cough, nasal congestion, and  dark green mucus production. Increased albuterol  use due to chest mucus. No fever reported. A previous similar episode resolved with Zyrtec  and cough medication. Differential includes sinus infection. Prescribe Z-Pak (azithromycin ) for sinus infection. Prescription sent to [facility] on [facility]. Advise contacting if symptoms do not improve.  Migraine without aura   Currently managed with Qulipta  daily, effectively reducing migraine frequency. Excedrin is used for infrequent breakthrough headaches. Continue Qulipta  daily for migraine prevention. Use Excedrin as needed for breakthrough headaches.  Major depressive disorder and anxiety disorder   Managed with Lexapro  15 mg daily, effectively controlling mood. Propranolol  is used as needed for anxiety, particularly during tests, approximately twice per month. Continue Lexapro  15 mg  daily. Use propranolol  as needed for anxiety.  Asthma   Managed with albuterol  and montelukast . No maintenance inhaler currently used. Increased albuterol  use due to recent upper respiratory symptoms. Continue albuterol  and montelukast  as needed.  Testosterone  therapy   Ongoing testosterone  therapy with reported positive effects. No adverse symptoms such as shakiness, jitteriness, or heart palpitations reported. Continue testosterone  therapy.      Meds ordered this encounter  Medications   Atogepant  (QULIPTA ) 60 MG TABS    Sig: Take 1 tablet by mouth daily.    Dispense:  90 tablet    Refill:  3   escitalopram  (LEXAPRO ) 10 MG tablet    Sig: Take 1.5 tablets (15 mg total) by mouth daily.    Dispense:  135 tablet    Refill:  1   DISCONTD: azithromycin  (ZITHROMAX ) 250 MG tablet    Sig: Take 2 tablets on day 1, then 1 tablet daily on days 2 through 5    Dispense:  6 tablet    Refill:  0   azithromycin  (ZITHROMAX ) 250 MG tablet    Sig: Take 2 tablets on day 1, then 1 tablet daily on days 2 through 5    Dispense:  6 tablet    Refill:  0    I discussed the  assessment and treatment plan with the patient. The patient was provided an opportunity to ask questions and all were answered. The patient agreed with the plan and demonstrated an understanding of the instructions.   The patient was advised to call back or seek an in-person evaluation if the symptoms worsen or if the condition fails to improve as anticipated.  Return in about 6 months (around 07/15/2024) for annual physical.  Jenkins CHRISTELLA Carrel, MD [facility] [phone] (phone) [phone] (fax)  [facility]

## 2024-01-24 ENCOUNTER — Telehealth: Payer: Self-pay

## 2024-01-24 NOTE — Telephone Encounter (Signed)
 PT no longer sees this provider-Family practice is handling migraine care at this time. This med was also discontinued.

## 2024-02-02 ENCOUNTER — Other Ambulatory Visit: Payer: Self-pay

## 2024-02-02 ENCOUNTER — Other Ambulatory Visit (HOSPITAL_COMMUNITY): Payer: Self-pay

## 2024-02-02 MED ORDER — TESTOSTERONE ENANTHATE 200 MG/ML IM SOLN
80.0000 mg | INTRAMUSCULAR | 0 refills | Status: AC
  Filled 2024-02-02: qty 5, 28d supply, fill #0
  Filled 2024-02-16: qty 15, 90d supply, fill #0
  Filled 2024-02-16: qty 5, 28d supply, fill #0
  Filled 2024-03-25: qty 5, 28d supply, fill #1

## 2024-02-05 ENCOUNTER — Other Ambulatory Visit: Payer: Self-pay

## 2024-02-05 ENCOUNTER — Other Ambulatory Visit (HOSPITAL_COMMUNITY): Payer: Self-pay

## 2024-02-16 ENCOUNTER — Other Ambulatory Visit (HOSPITAL_COMMUNITY): Payer: Self-pay

## 2024-02-16 ENCOUNTER — Other Ambulatory Visit: Payer: Self-pay

## 2024-02-22 ENCOUNTER — Other Ambulatory Visit (HOSPITAL_COMMUNITY): Payer: Self-pay

## 2024-02-26 ENCOUNTER — Other Ambulatory Visit (HOSPITAL_COMMUNITY): Payer: Self-pay

## 2024-02-26 ENCOUNTER — Telehealth (HOSPITAL_COMMUNITY): Payer: Self-pay

## 2024-02-26 NOTE — Telephone Encounter (Signed)
 Pharmacy Patient Advocate Encounter  Received notification from [employer] that Prior Authorization for Qulipta  60MG  tablets  has been APPROVED from 02/26/24 to 02/24/25. Ran test claim, Copay is $0. This test claim was processed through [facility]- copay amounts may vary at other pharmacies due to pharmacy/plan contracts, or as the patient moves through the different stages of their insurance plan.   PA #/Case ID/Reference #: [id number]

## 2024-02-26 NOTE — Telephone Encounter (Signed)
 Pharmacy Patient Advocate Encounter   Received notification from Patient Pharmacy that prior authorization for Qulipta  60MG  tablets  is required/requested.   Insurance verification completed.   The patient is insured through [facility].   Per test claim: PA required; PA submitted to above mentioned insurance via Latent Key/confirmation #/EOC [facility] Status is pending

## 2024-02-27 ENCOUNTER — Other Ambulatory Visit (HOSPITAL_COMMUNITY): Payer: Self-pay

## 2024-03-08 ENCOUNTER — Other Ambulatory Visit: Payer: Self-pay | Admitting: Family Medicine

## 2024-03-08 ENCOUNTER — Other Ambulatory Visit (HOSPITAL_COMMUNITY): Payer: Self-pay

## 2024-03-08 ENCOUNTER — Other Ambulatory Visit: Payer: Self-pay | Admitting: Allergy & Immunology

## 2024-03-08 MED ORDER — MONTELUKAST SODIUM 10 MG PO TABS
10.0000 mg | ORAL_TABLET | Freq: Every day | ORAL | 1 refills | Status: AC
  Filled 2024-03-08: qty 90, 90d supply, fill #0

## 2024-03-08 MED ORDER — AIRSUPRA 90-80 MCG/ACT IN AERO
2.0000 | INHALATION_SPRAY | RESPIRATORY_TRACT | 1 refills | Status: AC | PRN
  Filled 2024-03-08: qty 32.1, 90d supply, fill #0

## 2024-03-11 ENCOUNTER — Other Ambulatory Visit (HOSPITAL_COMMUNITY): Payer: Self-pay

## 2024-03-13 ENCOUNTER — Other Ambulatory Visit (HOSPITAL_COMMUNITY): Payer: Self-pay

## 2024-03-14 ENCOUNTER — Other Ambulatory Visit (HOSPITAL_COMMUNITY): Payer: Self-pay

## 2024-03-14 ENCOUNTER — Other Ambulatory Visit: Payer: Self-pay

## 2024-03-15 ENCOUNTER — Other Ambulatory Visit (HOSPITAL_COMMUNITY): Payer: Self-pay

## 2024-03-26 ENCOUNTER — Other Ambulatory Visit: Payer: Self-pay

## 2024-03-27 ENCOUNTER — Other Ambulatory Visit: Payer: Self-pay

## 2024-04-01 ENCOUNTER — Other Ambulatory Visit (HOSPITAL_COMMUNITY): Payer: Self-pay

## 2024-10-15 ENCOUNTER — Ambulatory Visit: Admitting: Allergy & Immunology
